# Patient Record
Sex: Female | Born: 1970 | Race: White | Hispanic: No | Marital: Married | Smoking: Never smoker
Health system: Southern US, Community
[De-identification: ages and names within clinical notes are randomized; demographics above are authoritative.]

## PROBLEM LIST (undated history)

## (undated) DIAGNOSIS — T7840XA Allergy, unspecified, initial encounter: Secondary | ICD-10-CM

## (undated) DIAGNOSIS — K9189 Other postprocedural complications and disorders of digestive system: Secondary | ICD-10-CM

## (undated) DIAGNOSIS — K567 Ileus, unspecified: Secondary | ICD-10-CM

## (undated) DIAGNOSIS — I10 Essential (primary) hypertension: Secondary | ICD-10-CM

## (undated) HISTORY — PX: ABDOMINAL HYSTERECTOMY: SHX81

## (undated) HISTORY — DX: Essential (primary) hypertension: I10

## (undated) HISTORY — DX: Allergy, unspecified, initial encounter: T78.40XA

---

## 1998-07-22 ENCOUNTER — Other Ambulatory Visit: Admission: RE | Admit: 1998-07-22 | Discharge: 1998-07-22 | Payer: Self-pay | Admitting: *Deleted

## 1998-11-21 ENCOUNTER — Encounter (INDEPENDENT_AMBULATORY_CARE_PROVIDER_SITE_OTHER): Payer: Self-pay

## 1998-11-21 ENCOUNTER — Ambulatory Visit (HOSPITAL_COMMUNITY): Admission: RE | Admit: 1998-11-21 | Discharge: 1998-11-21 | Payer: Self-pay | Admitting: Obstetrics and Gynecology

## 1999-04-09 ENCOUNTER — Other Ambulatory Visit: Admission: RE | Admit: 1999-04-09 | Discharge: 1999-04-09 | Payer: Self-pay | Admitting: *Deleted

## 1999-07-30 ENCOUNTER — Inpatient Hospital Stay (HOSPITAL_COMMUNITY): Admission: AD | Admit: 1999-07-30 | Discharge: 1999-07-30 | Payer: Self-pay | Admitting: Obstetrics and Gynecology

## 1999-08-21 ENCOUNTER — Inpatient Hospital Stay (HOSPITAL_COMMUNITY): Admission: AD | Admit: 1999-08-21 | Discharge: 1999-08-23 | Payer: Self-pay | Admitting: Obstetrics and Gynecology

## 1999-08-23 ENCOUNTER — Encounter: Payer: Self-pay | Admitting: Obstetrics and Gynecology

## 1999-09-12 ENCOUNTER — Inpatient Hospital Stay (HOSPITAL_COMMUNITY): Admission: AD | Admit: 1999-09-12 | Discharge: 1999-09-12 | Payer: Self-pay | Admitting: *Deleted

## 1999-09-12 ENCOUNTER — Encounter: Payer: Self-pay | Admitting: Obstetrics and Gynecology

## 1999-09-13 ENCOUNTER — Inpatient Hospital Stay (HOSPITAL_COMMUNITY): Admission: AD | Admit: 1999-09-13 | Discharge: 1999-09-17 | Payer: Self-pay | Admitting: Obstetrics and Gynecology

## 1999-10-01 ENCOUNTER — Ambulatory Visit (HOSPITAL_COMMUNITY): Admission: RE | Admit: 1999-10-01 | Discharge: 1999-10-01 | Payer: Self-pay | Admitting: *Deleted

## 1999-10-03 ENCOUNTER — Encounter (INDEPENDENT_AMBULATORY_CARE_PROVIDER_SITE_OTHER): Payer: Self-pay | Admitting: Specialist

## 1999-10-03 ENCOUNTER — Inpatient Hospital Stay (HOSPITAL_COMMUNITY): Admission: AD | Admit: 1999-10-03 | Discharge: 1999-10-06 | Payer: Self-pay | Admitting: Obstetrics & Gynecology

## 1999-11-12 ENCOUNTER — Other Ambulatory Visit: Admission: RE | Admit: 1999-11-12 | Discharge: 1999-11-12 | Payer: Self-pay | Admitting: *Deleted

## 2000-11-23 ENCOUNTER — Other Ambulatory Visit: Admission: RE | Admit: 2000-11-23 | Discharge: 2000-11-23 | Payer: Self-pay | Admitting: *Deleted

## 2001-09-26 ENCOUNTER — Encounter (INDEPENDENT_AMBULATORY_CARE_PROVIDER_SITE_OTHER): Payer: Self-pay | Admitting: Specialist

## 2001-09-27 ENCOUNTER — Inpatient Hospital Stay (HOSPITAL_COMMUNITY): Admission: RE | Admit: 2001-09-27 | Discharge: 2001-09-29 | Payer: Self-pay | Admitting: *Deleted

## 2004-01-10 ENCOUNTER — Other Ambulatory Visit: Admission: RE | Admit: 2004-01-10 | Discharge: 2004-01-10 | Payer: Self-pay | Admitting: Obstetrics and Gynecology

## 2006-05-27 ENCOUNTER — Encounter: Admission: RE | Admit: 2006-05-27 | Discharge: 2006-05-27 | Payer: Self-pay | Admitting: Obstetrics and Gynecology

## 2006-11-16 ENCOUNTER — Encounter: Admission: RE | Admit: 2006-11-16 | Discharge: 2006-11-16 | Payer: Self-pay | Admitting: Obstetrics and Gynecology

## 2007-05-29 ENCOUNTER — Encounter: Admission: RE | Admit: 2007-05-29 | Discharge: 2007-05-29 | Payer: Self-pay | Admitting: Obstetrics and Gynecology

## 2008-05-29 ENCOUNTER — Encounter: Admission: RE | Admit: 2008-05-29 | Discharge: 2008-05-29 | Payer: Self-pay | Admitting: Obstetrics and Gynecology

## 2009-09-23 ENCOUNTER — Encounter: Admission: RE | Admit: 2009-09-23 | Discharge: 2009-09-23 | Payer: Self-pay | Admitting: Obstetrics and Gynecology

## 2010-07-24 NOTE — Discharge Summary (Signed)
   NAMEHERO, MCCATHERN NO.:  [id number]   MEDICAL RECORD NO.:  [id number]                   PATIENT TYPE:   LOCATION:                                       FACILITY:   PHYSICIAN:  Tracie Harrier, M.D.              DATE OF BIRTH:   DATE OF ADMISSION:  09/26/2001  DATE OF DISCHARGE:  09/29/2001                                 DISCHARGE SUMMARY   HISTORY OF PRESENT ILLNESS:  The patient is a 40 year old female gravida 3,  para 2 status post tubal ligation admitted for hysterectomy for severe  dysmenorrhea and menometrorrhagia.   PAST MEDICAL HISTORY:  None.   PAST SURGICAL HISTORY:  1. D&E x1.  2. Cesarean section x2.   OBSTETRICAL HISTORY:  1. Cesarean section x2--second cesarean section with associated bilateral     tubal ligation.  2. SAB x1 with D&C.   CURRENT MEDICATIONS:  Motrin p.r.n.   ALLERGIES:  CODEINE.   PHYSICAL EXAMINATION:  Please see clinic admission History and Physical.   ADMISSION DIAGNOSES:  1. Severe dysmenorrhea.  2. Menometrorrhagia.   HOSPITAL COURSE:  The patient was admitted on the same day of surgery, September 26, 2001, where she underwent a laparoscopic-assisted vaginal hysterectomy  with ovarian conservation.  The surgery went well and uneventfully.  Blood  loss was 150 cc.  There was only scarring of the anterior bladder flap  noted.   The patient's postoperative course was somewhat slow.  Her postoperative  period was associated with nausea and vomiting and pain management issues.  She did slowly improve and was discharged on postoperative day #3 in stable  condition.  She had no serious complications in the postoperative period  other than some nausea and vomiting as mentioned.  She was afebrile in the  postoperative period and quickly resumed normal bowel and bladder function.  She did have pain management issues and as mentioned nausea and vomiting.   DISCHARGE DIAGNOSIS:  Stable status post  laparoscopic-assisted vaginal  hysterectomy.   PLAN:  1. Routine postoperative instructions given.  2. Nothing per vagina for six weeks.  3.     No heavy lifting or driving x2 weeks.  4. On Percocet dispense #50.  5. Follow up in the office in one week for a recheck.                                               Tracie Harrier, M.D.    REG/MEDQ  D:  11/05/2001  T:  11/05/2001  Job:  [id number]

## 2010-07-24 NOTE — H&P (Signed)
[facility]  Patient:    Hannah Farmer, Hannah Farmer Visit Number: [id number] MRN: [id number]          Service Type: GYN Location: 9300 [id number] Attending Physician:  Donne Hazel Dictated by:   Willey Blade, M.D. Admit Date:  09/26/2001                           History and Physical  HISTORY OF PRESENT ILLNESS:   The patient is a 40 year old female, gravida 3, para 2, status post bilateral tubal ligation, admitted for definitive therapy of severe dysmenorrhea and menometrorrhagia.  The patient was given different treatment options, including hormonal therapy versus conservative surgical treatment and requested a definitive surgical cure of her abnormal cycles and severe cramps during her menstrual cycle and during bleeding.  The patients symptoms are not improved by nonsteroidal anti-inflammatory agents and she is not interested in being treated hormonally.  She is now admitted to undergo laparoscopically-assisted vaginal hysterectomy.  PAST MEDICAL HISTORY:         None.  PAST SURGICAL HISTORY:        1. D&E x 1.                               2. Cesarean section x 2.  OBSTETRICAL HISTORY:          1. In 1998, a cesarean section at term.                               2. In 2000, SAB with D&C.                               3. In 2001, repeat cesarean section and                                  bilateral tubal ligation.  CURRENT MEDICATIONS:          Motrin p.r.n.  ALLERGIES:                    CODEINE.  PHYSICAL EXAMINATION:         Vital signs stable with blood pressure 108/70.  GENERAL APPEARANCE:           She is a well-developed, well-nourished female in no acute distress.  HEENT:                        Within normal limits.  NECK:                         Supple without adenopathy or thyromegaly.  HEART:                        Regular rate and rhythm without murmurs, rubs, or gallops.  LUNGS:                        Clear to  auscultation.  BREASTS:                      Exam done recently in the office was normal without masses,  tenderness, nipple discharge, or axillary adenopathy.  ABDOMEN:                      Soft and benign without masses, tenderness, or hernia.  PELVIC:                       Normal female external genitalia.  Vaginal and cervical normal without lesions.  The uterus is small, anterior, and mobile. The adnexa are clear.  ADMISSION DIAGNOSES:          1. Severe dysmenorrhea.                               2. Menometrorrhagia.                               3. Suspect adenomyosis.  PLAN:                         Laparoscopically-assisted vaginal hysterectomy.  The risks and benefits of this surgery were explained to the patient.  Also, alternative treatment options were reviewed with the patient.  She wished to proceed. Dictated by:   Willey Blade, M.D. Attending Physician:  Donne Hazel DD:  09/25/01 TD:  09/28/01 Job: [id number]

## 2010-07-24 NOTE — Op Note (Signed)
[facility]  Patient:    Hannah Farmer, Hannah Farmer Visit Number: [id number] MRN: [id number]          Service Type: GYN Location: [phone] Attending Physician:  Donne Hazel Dictated by:   Willey Blade, M.D. Proc. Date: 09/26/01 Admit Date:  09/26/2001 Discharge Date: 09/29/2001                             Operative Report  PREOPERATIVE DIAGNOSES:       1. Menometrorrhagia.                               2. Dysmenorrhea.  POSTOPERATIVE DIAGNOSES:      1. Menometrorrhagia.                               2. Dysmenorrhea.  OPERATION:                    Laparoscopic-assisted vaginal hysterectomy.  SURGEON:                      Willey Blade, M.D.  ANESTHESIA:                   General endotracheal.  ESTIMATED BLOOD LOSS:         150 cc.  COMPLICATIONS:                None.  DRAINS:                       Foley.  FINDINGS:                     At time of laparoscopy, the abdomen and pelvis were visualized. The uterus was of normal size and shape. There were some adhesions between the uterus and the bladder flap. The adnexa were visualized and noted to be normal except for patients previous tubal ligation.  The bowel was briefly visualized and noted to be normal. The appendix was also visualized and noted to be normal.  DESCRIPTION OF PROCEDURE:     The patient was taken to the operating room where general endotracheal anesthetic was administered. The patient was placed on the operating table in the dorsal lithotomy position. The abdomen, perineum, and vagina were prepped and draped in the usual sterile fashion with Betadine and sterile drapes. A red rubber catheter was used to empty the bladder. A Hulka tenaculum was placed inside the uterus for uterine manipulation. Next, a small vertical infraumbilical skin incision was made through which a Verres needle was inserted atraumatically into the abdominal cavity. A pneumoperitoneum was then  created with carbon dioxide gas until a pressure of 18 mmHg was achieved. The Verres needle was removed and a disposable laparoscopic trocar was inserted atraumatically into the abdominal cavity. Also, an accessory incision was made two fingerbreadths above the pubis symphysis and in the midline. Through this, a disposable 5-mm trocar was placed without difficulty. Next, the above noted findings were noted through the laparoscope. Attention was next turned to laparoscopic-assisted vaginal hysterectomy.  The uterus was dissected using the Gyrus 5 mm open forceps and this was done. The uterus was removed from the surrounding tissue with a burn and cut method with the Gyrus forceps. The utero-ovarian  ligaments were cauterized and divided with the Gyrus open forceps. Dissection was carried out and the uterus was dissected from the surrounding tissues with the Gyrus forceps until just inferior to the uterine artery pedicles. All vascular pedicles were cauterized with the Gyrus open forceps and divided with this as well. Again, this dissection was undertaken bilaterally from the utero-ovarian ligaments inferiorly to just beneath the uterine artery pedicles. Once this was undertaken, good hemostasis was noted and attention was turned to the vaginal aspect of this hysterectomy.  All vaginal instruments were removed. A weighted speculum was placed in the posterior fornix of the vagina. The cervix was grasped with a Gerilyn Pilgrim tenaculum and the posterior fornix was entered sharply and atraumatically. The uterosacral ligaments were then clamped bilaterally with the Gyrus open forceps and cauterized in the standard fashion and divided with sharp dissection. A circumferential incision was then made around the anterior portion of the cervix, then a bladder flap was bluntly and sharply created over the lower uterine segment and dissection was carried out cephalad. A bladder blade was placed behind the  bladder. Successive bites were then carried up the body of the uterus, hugging the uterus intently, and all vascular pedicles were again cauterized with the Gyrus open forceps and divided sharply. The lines of dissection were met and the uterus was dissected free without difficulty. Prior to this, the anterior cul-de-sac was atraumatically and sharply entered. At all times, the bladder was protected by having a retractor placed behind it during the procedure. Next, attention was turned to closure. All vascular pedicles were hemostatic.  Attention was turned to closure. The posterior cul-de-sac was obliterated by plicating the uterosacral ligaments in the midline with two sutures of 0 Vicryl. The vaginal cuff was then suspended to the uterosacral ligaments with a transfixing suture of 0 Vicryl bilaterally. The vaginal cuff was then closed anterior to posterior using 0 Monocryl sutures. This was done in its entirety. Good hemostasis was noted. A Foley was then inserted with clear urine obtained. The vagina was noted to be hemostatic.  The laparoscope was then used once again to visualize the operative areas and hemostasis was noted. The pelvis was irrigated using a Nezhat-Dorsey irrigator. After hemostasis was noted, all abdominal instruments were removed and the gas was allowed to completely escape from the abdomen. The small infraumbilical skin incision was closed with two sutures of 0 Vicryl in the muscle fascia. The skin was reapproximated with DermaBond glue. The patient was awakened, extubated, and taken to the recovery room in good condition. There were no perioperative complications. Final sponge, instrument, and needle counts x3. The patient did receive a preoperative antibiotic. Dictated by:   Willey Blade, M.D. Attending Physician:  Donne Hazel DD:  09/26/01 TD:  09/29/01 Job: [id number]

## 2010-07-24 NOTE — Discharge Summary (Signed)
[facility]  Patient:    Hannah Farmer                     MRN: [id number] Adm. Date:  [id number] Disc. Date: [id number] Attending:  Minette Headland Dictator:   Danie Chandler, R.N.                           Discharge Summary  ADMISSION DIAGNOSES:          1. Intrauterine pregnancy at 3 weeks estimated                                  gestational age.                               2. Anterior placenta previa.                               3. Previous cesarean section.                               4. Contractions with bleeding.  DISCHARGE DIAGNOSES:          1. Intrauterine pregnancy at 29 weeks estimated                                  gestational age.                               2. Anterior placenta previa.                               3. Previous cesarean section.                               4. Contractions with bleeding.  PROCEDURE:                    On October 03, 1999, repeat low transverse cesarean section and bilateral tubal ligation.  REASON FOR ADMISSION:         The patient is a 40 year old married white female, gravida 2, para 1, at [redacted] weeks gestation with previous cesarean section and known anterior placenta previa.  An amniocentesis was performed on September 30, 1999, which was consistent with an LS ratio of 1.9:1.  The patient presented on the night of October 03, 1999, with heavy vaginal bleeding which rapidly resolved on its own.  The patient continued to contract on October 03, 1999.  The patient had been on a terbutaline pump and had been covered in the hospital with subcutaneous terbutaline.  Due to the gestational age of [redacted] weeks and the heavy vaginal bleeding, it was recommended to proceed with a cesarean section.  HOSPITAL COURSE:              The patient was taken to the operating room and underwent the above-named procedure without complication.  This was productive of a viable female infant with Apgars of 8 at  1 minute  and 8 at 5 minutes and an anterior cord pH of 7.29.  Postoperatively, the patient did well.  On postoperative day #1, the patients hemoglobin was 9.3.  The patient was stable on postoperative day #2.  The patient had a good return of bowel function and was tolerating a regular diet.  She was also ambulating well without difficulty and had good pain control.  She was discharged to home on postoperative day #3.  CONDITION ON DISCHARGE:       Good.  DIET:                         Regular as tolerated.  ACTIVITY:                     No heavy lifting, no driving, no vaginal entry.  FOLLOWUP:                     She is to follow up in the office in one to two weeks for incision check, and she is to call for temperature greater than 100 degrees, persistent nausea or vomiting, heavy vaginal bleeding, and/or redness or drainage from the incision site.  DISCHARGE MEDICATIONS:        1. Prenatal vitamin 1 p.o. q.d.                               2. Tylox, #30, 1 to 2 p.o. q.3-4h. p.r.n. pain. DD:  10/26/99 TD:  10/27/99 Job: [id number]

## 2010-07-24 NOTE — Op Note (Signed)
[facility]  Patient:    Hannah Farmer                     MRN: [id number] Proc. Date: 10/03/99 Adm. Date:  [id number] Disc. Date: [id number] Attending:  Minette Headland                           Operative Report  PREOPERATIVE DIAGNOSES:       1. Intrauterine pregnancy at 68 weeks estimated                                  gestational age.                               2. Anterior placenta previa.                               3. Previous cesarean section.                               4. Contractions with bleeding.  POSTOPERATIVE DIAGNOSES:      1. Intrauterine pregnancy at 52 weeks estimated                                  gestational age.                               2. Anterior placenta previa.                               3. Previous cesarean section.                               4. Contractions with bleeding.  PROCEDURE:                    1. Repeat low transverse cesarean section.                               2. Bilateral tubal ligation.  SURGEON:                      Guy Sandifer. Arleta Creek, M.D.  ASSISTANTFreddy Finner, M.D.  ANESTHESIA:                   Spinal by Belva Agee, M.D.  ESTIMATED BLOOD LOSS:         900 cc.  FINDINGS:                     Viable female infant, Apgars of 8/8 at one and five minutes, respectively.  Birth weight 6 pounds 2 ounces.  Arterial cord: pH 7.29.  INDICATIONS AND CONSENTS:     The patient is a 40 year old married white female, G2, P1  at 36 weeks with previous cesarean section and known anterior placenta previa.  Amniocentesis on July 25 was consistent with an LS ratio of 1.9 to 1.  The patient presented last night with heavy vaginal bleeding which rapidly resolved on its own.  Continues to contract today.  She has been on a Terbutaline pump and she has been covered in the hospital with subcutaneous Terbutaline.  Due to the gestational age of [redacted] weeks and the heavy  vaginal bleeding with its unpredictable recurrence and potentially life threatening outcome for the baby and/or the mother a cesarean section is recommended. This was discussed with the patient and her husband.  Possible risks and complications were discussed including but not limited to infection, bowel/bladder/ureteral damage, bleeding requiring transfusion and blood products with possible transfusion reaction, HIV and hepatitis acquisition, DVT, PE, pneumonia and cesarean hysterectomy.  The patient also requests tubal ligation and this has been discussed.  permanence, failure rate and increased ectopic risks were discussed. The patients blood type is A positive.  The blood bank in the hospital states they have two units of A positive and two units of A-negative blood.  An A typing screen is available in the blood bank at the present time.  DESCRIPTION OF PROCEDURE:     The patient is taken to the operating room where spinal anesthetic is placed.  She was then placed in the dorsal supine position with a 15 degree left lateral wedge.  She is prepped.  A Foley catheter was placed in the bladder as a drain and she is draped in a sterile fashion.  After testing for adequate anesthesia, the skin is entered through a previous Pfannenstiel scar and dissection was carried out in layers of the peritoneum.  The peritoneum was sharply entered and extending superiorly and inferiorly.  The vesicouterine peritoneum is taken down cephalolaterally. laterally.  The bladder flap is developed and the bladder blade is placed. Uterus is incised in a low transverse manner and the cavity is entered bluntly with a Kelly clamp.  The uterine incision is then extended cephalolaterally laterally with the fingers.  The placenta is encountered anteriorly.  This is dissected from the anterior vaginal wall and reaching above the level of the placental margin.  Artificial rupture of membranes is carried out.  The  infant is in the transverse backup position.  The feet are grasped.  The babys hips are mobilized and then is delivered from the Breech presentation without difficulty.  Oronasal pharynx are suctioned.  The cord is clamped and cut and the infant is handed to the waiting pediatrics team.  The placenta is manually delivered and sent to pathology.  Uterus is exteriorized and careful inspection reveals the placenta has been completely removed and there is no excessive bleeding from the placental bed.  The uterus is closed in a running locking fashion with 0 Monocryl suture.  There is a laceration of a branch of the cervical artery on the right angle of the incision.  This was controlled initially with a ring clamp.  This was ligated with an additional through and through suture and followed by a figure-of-eight suture which was observed both carefully anteriorly and posteriorly on the uterine surfaces.  This ligates the artery and obtains complete hemostasis.  Next, the left fallopian tube is grasped in its midportion with a Babcock clamp.  Knuckle of tube is then doubly ligated with 0-3 ties.  Intervening knuckle of tissue is resected. Minimal monopolar cautery is used to assure hemostasis.  A similar procedure is carried out on the right side.  These are sent to pathology.  Ovaries are normal bilaterally.  Uterus is returned to the abdominal cavity.  Copious irrigation is carried out and good hemostasis is again assured.  The anterior peritoneum is then closed in a running fashion with 0 Monocryl suture which is also used to reapproximate the pyramidis muscle in the midline.  Anterior rectus fascia is closed in a running fashion with 0 PDS suture.  The skin is closed with clips.  All sponge, instrument and needle counts are correct.  The patient is transferred to the recovery room in stable condition. DD:  10/03/99 TD:  10/05/99 Job: [id number]

## 2010-11-24 ENCOUNTER — Other Ambulatory Visit: Payer: Self-pay | Admitting: Obstetrics and Gynecology

## 2010-11-24 DIAGNOSIS — Z1231 Encounter for screening mammogram for malignant neoplasm of breast: Secondary | ICD-10-CM

## 2010-12-01 ENCOUNTER — Ambulatory Visit
Admission: RE | Admit: 2010-12-01 | Discharge: 2010-12-01 | Disposition: A | Discharge status: Home / Self Care | Payer: 59 | Source: Ambulatory Visit | Attending: Obstetrics and Gynecology | Admitting: Obstetrics and Gynecology

## 2010-12-01 DIAGNOSIS — Z1231 Encounter for screening mammogram for malignant neoplasm of breast: Secondary | ICD-10-CM

## 2012-01-27 ENCOUNTER — Other Ambulatory Visit: Payer: Self-pay | Admitting: Obstetrics and Gynecology

## 2012-01-27 DIAGNOSIS — Z1231 Encounter for screening mammogram for malignant neoplasm of breast: Secondary | ICD-10-CM

## 2012-02-16 ENCOUNTER — Ambulatory Visit
Admission: RE | Admit: 2012-02-16 | Discharge: 2012-02-16 | Disposition: A | Discharge status: Home / Self Care | Payer: 59 | Source: Ambulatory Visit | Attending: Obstetrics and Gynecology | Admitting: Obstetrics and Gynecology

## 2012-02-16 DIAGNOSIS — Z1231 Encounter for screening mammogram for malignant neoplasm of breast: Secondary | ICD-10-CM

## 2012-12-28 ENCOUNTER — Ambulatory Visit: Payer: 59 | Admitting: Family Medicine

## 2012-12-28 DIAGNOSIS — Z0289 Encounter for other administrative examinations: Secondary | ICD-10-CM

## 2013-04-30 ENCOUNTER — Other Ambulatory Visit: Payer: Self-pay

## 2013-04-30 DIAGNOSIS — Z1231 Encounter for screening mammogram for malignant neoplasm of breast: Secondary | ICD-10-CM

## 2013-05-11 ENCOUNTER — Ambulatory Visit: Payer: 59

## 2014-01-20 ENCOUNTER — Encounter (HOSPITAL_COMMUNITY): Admission: EM | Disposition: A | Discharge status: Home / Self Care | Payer: Self-pay | Source: Home / Self Care

## 2014-01-20 ENCOUNTER — Emergency Department (HOSPITAL_COMMUNITY): Payer: 59

## 2014-01-20 ENCOUNTER — Emergency Department (HOSPITAL_COMMUNITY): Payer: 59 | Admitting: Anesthesiology

## 2014-01-20 ENCOUNTER — Other Ambulatory Visit (HOSPITAL_COMMUNITY): Payer: 59

## 2014-01-20 ENCOUNTER — Ambulatory Visit (INDEPENDENT_AMBULATORY_CARE_PROVIDER_SITE_OTHER): Payer: 59

## 2014-01-20 ENCOUNTER — Inpatient Hospital Stay (HOSPITAL_COMMUNITY)
Admission: EM | Admit: 2014-01-20 | Discharge: 2014-01-23 | DRG: 340 | Disposition: A | Discharge status: Home / Self Care | Payer: 59 | Attending: General Surgery | Admitting: General Surgery

## 2014-01-20 ENCOUNTER — Encounter (HOSPITAL_COMMUNITY): Payer: Self-pay | Admitting: Emergency Medicine

## 2014-01-20 ENCOUNTER — Ambulatory Visit (INDEPENDENT_AMBULATORY_CARE_PROVIDER_SITE_OTHER): Payer: 59 | Admitting: Internal Medicine

## 2014-01-20 VITALS — BP 144/92 | HR 85 | Temp 98.5°F | Resp 16 | Ht 64.5 in | Wt 170.0 lb

## 2014-01-20 DIAGNOSIS — K352 Acute appendicitis with generalized peritonitis: Secondary | ICD-10-CM | POA: Diagnosis not present

## 2014-01-20 DIAGNOSIS — K3532 Acute appendicitis with perforation and localized peritonitis, without abscess: Secondary | ICD-10-CM | POA: Diagnosis present

## 2014-01-20 DIAGNOSIS — R1031 Right lower quadrant pain: Secondary | ICD-10-CM

## 2014-01-20 DIAGNOSIS — K9189 Other postprocedural complications and disorders of digestive system: Secondary | ICD-10-CM

## 2014-01-20 DIAGNOSIS — K567 Ileus, unspecified: Secondary | ICD-10-CM

## 2014-01-20 DIAGNOSIS — R100 Acute abdomen: Secondary | ICD-10-CM

## 2014-01-20 DIAGNOSIS — K3580 Unspecified acute appendicitis: Secondary | ICD-10-CM | POA: Diagnosis present

## 2014-01-20 DIAGNOSIS — Z90710 Acquired absence of both cervix and uterus: Secondary | ICD-10-CM

## 2014-01-20 HISTORY — PX: LAPAROSCOPIC APPENDECTOMY: SHX408

## 2014-01-20 HISTORY — DX: Ileus, unspecified: K56.7

## 2014-01-20 HISTORY — DX: Other postprocedural complications and disorders of digestive system: K91.89

## 2014-01-20 LAB — COMPREHENSIVE METABOLIC PANEL
ALBUMIN: 4 g/dL (ref 3.5–5.2)
ALT: 31 U/L (ref 0–35)
ANION GAP: 16 — AB (ref 5–15)
AST: 34 U/L (ref 0–37)
Alkaline Phosphatase: 66 U/L (ref 39–117)
BUN: 11 mg/dL (ref 6–23)
CALCIUM: 9.4 mg/dL (ref 8.4–10.5)
CO2: 21 mEq/L (ref 19–32)
CREATININE: 0.76 mg/dL (ref 0.50–1.10)
Chloride: 97 mEq/L (ref 96–112)
GFR calc Af Amer: >90 mL/min (ref >90)
GFR calc non Af Amer: >90 mL/min (ref >90)
Glucose, Bld: 88 mg/dL (ref 70–99)
Potassium: 4.6 mEq/L (ref 3.7–5.3)
Sodium: 134 mEq/L — ABNORMAL LOW (ref 137–147)
TOTAL PROTEIN: 7.9 g/dL (ref 6.0–8.3)
Total Bilirubin: 0.7 mg/dL (ref 0.3–1.2)

## 2014-01-20 LAB — COMPLETE METABOLIC PANEL WITH GFR
ALBUMIN: 4.8 g/dL (ref 3.5–5.2)
ALT: 31 U/L (ref 0–35)
AST: 27 U/L (ref 0–37)
Alkaline Phosphatase: 65 U/L (ref 39–117)
BILIRUBIN TOTAL: 0.7 mg/dL (ref 0.2–1.2)
BUN: 11 mg/dL (ref 6–23)
CO2: 25 meq/L (ref 19–32)
Calcium: 9.2 mg/dL (ref 8.4–10.5)
Chloride: 101 mEq/L (ref 96–112)
Creat: 0.8 mg/dL (ref 0.50–1.10)
GFR, Est Non African American: >89 mL/min
GLUCOSE: 104 mg/dL — AB (ref 70–99)
POTASSIUM: 3.8 meq/L (ref 3.5–5.3)
SODIUM: 137 meq/L (ref 135–145)
TOTAL PROTEIN: 7.8 g/dL (ref 6.0–8.3)

## 2014-01-20 LAB — CBC WITH DIFFERENTIAL/PLATELET
BASOS ABS: 0 10*3/uL (ref 0.0–0.1)
BASOS PCT: 0 % (ref 0–1)
EOS ABS: 0 10*3/uL (ref 0.0–0.7)
Eosinophils Relative: 1 % (ref 0–5)
HEMATOCRIT: 43.9 % (ref 36.0–46.0)
HEMOGLOBIN: 14.8 g/dL (ref 12.0–15.0)
Lymphocytes Relative: 23 % (ref 12–46)
Lymphs Abs: 1.9 10*3/uL (ref 0.7–4.0)
MCH: 30.8 pg (ref 26.0–34.0)
MCHC: 33.7 g/dL (ref 30.0–36.0)
MCV: 91.3 fL (ref 78.0–100.0)
MONO ABS: 0.5 10*3/uL (ref 0.1–1.0)
MONOS PCT: 5 % (ref 3–12)
NEUTROS PCT: 71 % (ref 43–77)
Neutro Abs: 6 10*3/uL (ref 1.7–7.7)
Platelets: 238 10*3/uL (ref 150–400)
RBC: 4.81 MIL/uL (ref 3.87–5.11)
RDW: 12.6 % (ref 11.5–15.5)
WBC: 8.5 10*3/uL (ref 4.0–10.5)

## 2014-01-20 LAB — URINALYSIS, ROUTINE W REFLEX MICROSCOPIC
BILIRUBIN URINE: NEGATIVE
Glucose, UA: NEGATIVE mg/dL
Hgb urine dipstick: NEGATIVE
Ketones, ur: 15 mg/dL — AB
LEUKOCYTES UA: NEGATIVE
NITRITE: NEGATIVE
Protein, ur: NEGATIVE mg/dL
SPECIFIC GRAVITY, URINE: 1.016 (ref 1.005–1.030)
UROBILINOGEN UA: 0.2 mg/dL (ref 0.0–1.0)
pH: 5.5 (ref 5.0–8.0)

## 2014-01-20 LAB — POCT CBC
GRANULOCYTE PERCENT: 75.9 % (ref 37–80)
HCT, POC: 46.4 % (ref 37.7–47.9)
Hemoglobin: 15.5 g/dL (ref 12.2–16.2)
Lymph, poc: 1.9 (ref 0.6–3.4)
MCH, POC: 31.2 pg (ref 27–31.2)
MCHC: 33.5 g/dL (ref 31.8–35.4)
MCV: 93.2 fL (ref 80–97)
MID (CBC): 0.4 (ref 0–0.9)
MPV: 6.9 fL (ref 0–99.8)
PLATELET COUNT, POC: 315 10*3/uL (ref 142–424)
POC GRANULOCYTE: 7.3 — AB (ref 2–6.9)
POC LYMPH %: 19.8 % (ref 10–50)
POC MID %: 4.3 % (ref 0–12)
RBC: 4.98 M/uL (ref 4.04–5.48)
RDW, POC: 12.8 %
WBC: 9.6 10*3/uL (ref 4.6–10.2)

## 2014-01-20 LAB — POCT URINALYSIS DIPSTICK
BILIRUBIN UA: NEGATIVE
Blood, UA: NEGATIVE
GLUCOSE UA: NEGATIVE
KETONES UA: NEGATIVE
LEUKOCYTES UA: NEGATIVE
Nitrite, UA: NEGATIVE
PROTEIN UA: NEGATIVE
SPEC GRAV UA: 1.02
Urobilinogen, UA: 0.2
pH, UA: 5

## 2014-01-20 LAB — POCT UA - MICROSCOPIC ONLY
BACTERIA, U MICROSCOPIC: NEGATIVE
CRYSTALS, UR, HPF, POC: NEGATIVE
Casts, Ur, LPF, POC: NEGATIVE
Mucus, UA: NEGATIVE
RBC, URINE, MICROSCOPIC: NEGATIVE
WBC, UR, HPF, POC: NEGATIVE
Yeast, UA: NEGATIVE

## 2014-01-20 LAB — LIPASE, BLOOD: LIPASE: 29 U/L (ref 11–59)

## 2014-01-20 SURGERY — APPENDECTOMY, LAPAROSCOPIC
Anesthesia: General | Site: Abdomen

## 2014-01-20 MED ORDER — ACETAMINOPHEN 10 MG/ML IV SOLN
1000.0000 mg | Freq: Once | INTRAVENOUS | Status: AC
  Administered 2014-01-20: 1000 mg via INTRAVENOUS
  Filled 2014-01-20: qty 100

## 2014-01-20 MED ORDER — FENTANYL CITRATE 0.05 MG/ML IJ SOLN
INTRAMUSCULAR | Status: DC | PRN
  Administered 2014-01-20 (×5): 50 ug via INTRAVENOUS

## 2014-01-20 MED ORDER — SUCCINYLCHOLINE CHLORIDE 20 MG/ML IJ SOLN
INTRAMUSCULAR | Status: DC | PRN
  Administered 2014-01-20: 80 mg via INTRAVENOUS

## 2014-01-20 MED ORDER — HYDROMORPHONE HCL 1 MG/ML IJ SOLN
INTRAMUSCULAR | Status: AC
  Filled 2014-01-20: qty 1

## 2014-01-20 MED ORDER — GLYCOPYRROLATE 0.2 MG/ML IJ SOLN
INTRAMUSCULAR | Status: AC
  Filled 2014-01-20: qty 3

## 2014-01-20 MED ORDER — OXYCODONE HCL 5 MG PO TABS
5.0000 mg | ORAL_TABLET | ORAL | Status: DC | PRN
  Administered 2014-01-21 (×2): 5 mg via ORAL
  Filled 2014-01-20 (×2): qty 1

## 2014-01-20 MED ORDER — BUPIVACAINE HCL 0.25 % IJ SOLN
INTRAMUSCULAR | Status: DC | PRN
  Administered 2014-01-20: 15 mL

## 2014-01-20 MED ORDER — LIP MEDEX EX OINT
TOPICAL_OINTMENT | CUTANEOUS | Status: AC
Start: 2014-01-20 — End: 2014-01-21
  Filled 2014-01-20: qty 7

## 2014-01-20 MED ORDER — ONDANSETRON HCL 4 MG/2ML IJ SOLN: 4.0000 mg | Freq: Once | INTRAMUSCULAR | Status: DC | PRN

## 2014-01-20 MED ORDER — DIPHENHYDRAMINE HCL 50 MG/ML IJ SOLN: 12.5000 mg | Freq: Four times a day (QID) | INTRAMUSCULAR | Status: DC | PRN

## 2014-01-20 MED ORDER — HYDROMORPHONE HCL 1 MG/ML IJ SOLN
0.2500 mg | INTRAMUSCULAR | Status: DC | PRN
  Administered 2014-01-20 (×3): 0.5 mg via INTRAVENOUS

## 2014-01-20 MED ORDER — SODIUM CHLORIDE 0.9 % IV SOLN
INTRAVENOUS | Status: DC
  Administered 2014-01-20: 15:00:00 via INTRAVENOUS

## 2014-01-20 MED ORDER — ONDANSETRON HCL 4 MG/2ML IJ SOLN
INTRAMUSCULAR | Status: AC
  Filled 2014-01-20: qty 2

## 2014-01-20 MED ORDER — PROPOFOL 10 MG/ML IV BOLUS
INTRAVENOUS | Status: AC
  Filled 2014-01-20: qty 20

## 2014-01-20 MED ORDER — LIDOCAINE HCL (CARDIAC) 20 MG/ML IV SOLN
INTRAVENOUS | Status: DC | PRN
  Administered 2014-01-20: 75 mg via INTRAVENOUS

## 2014-01-20 MED ORDER — MORPHINE SULFATE 2 MG/ML IJ SOLN
2.0000 mg | INTRAMUSCULAR | Status: DC | PRN
  Administered 2014-01-20 (×2): 2 mg via INTRAVENOUS
  Filled 2014-01-20 (×2): qty 1

## 2014-01-20 MED ORDER — SODIUM CHLORIDE 0.9 % IV SOLN
INTRAVENOUS | Status: DC
  Administered 2014-01-20 – 2014-01-22 (×3): via INTRAVENOUS
  Administered 2014-01-22: 100 mL/h via INTRAVENOUS
  Administered 2014-01-23: 08:00:00 via INTRAVENOUS

## 2014-01-20 MED ORDER — IOHEXOL 300 MG/ML  SOLN
100.0000 mL | Freq: Once | INTRAMUSCULAR | Status: AC | PRN
  Administered 2014-01-20: 100 mL via INTRAVENOUS

## 2014-01-20 MED ORDER — LIDOCAINE HCL (CARDIAC) 20 MG/ML IV SOLN
INTRAVENOUS | Status: AC
  Filled 2014-01-20: qty 5

## 2014-01-20 MED ORDER — FENTANYL CITRATE 0.05 MG/ML IJ SOLN
50.0000 ug | Freq: Once | INTRAMUSCULAR | Status: AC
  Administered 2014-01-20: 50 ug via INTRAVENOUS
  Filled 2014-01-20: qty 2

## 2014-01-20 MED ORDER — ONDANSETRON HCL 4 MG/2ML IJ SOLN
INTRAMUSCULAR | Status: DC | PRN
  Administered 2014-01-20: 4 mg via INTRAVENOUS

## 2014-01-20 MED ORDER — SODIUM CHLORIDE 0.9 % IV BOLUS (SEPSIS)
1000.0000 mL | Freq: Once | INTRAVENOUS | Status: AC
  Administered 2014-01-20: 1000 mL via INTRAVENOUS

## 2014-01-20 MED ORDER — LACTATED RINGERS IR SOLN
Status: DC | PRN
  Administered 2014-01-20: 1000 mL

## 2014-01-20 MED ORDER — LACTATED RINGERS IV SOLN
INTRAVENOUS | Status: DC | PRN
  Administered 2014-01-20 (×2): via INTRAVENOUS

## 2014-01-20 MED ORDER — SODIUM CHLORIDE 0.9 % IJ SOLN
INTRAMUSCULAR | Status: AC
  Filled 2014-01-20: qty 50

## 2014-01-20 MED ORDER — MIDAZOLAM HCL 5 MG/5ML IJ SOLN
INTRAMUSCULAR | Status: DC | PRN
  Administered 2014-01-20: 2 mg via INTRAVENOUS

## 2014-01-20 MED ORDER — ONDANSETRON HCL 4 MG/2ML IJ SOLN
4.0000 mg | Freq: Four times a day (QID) | INTRAMUSCULAR | Status: DC | PRN
  Administered 2014-01-22 – 2014-01-23 (×2): 4 mg via INTRAVENOUS
  Filled 2014-01-20 (×2): qty 2

## 2014-01-20 MED ORDER — 0.9 % SODIUM CHLORIDE (POUR BTL) OPTIME
TOPICAL | Status: DC | PRN
  Administered 2014-01-20: 1000 mL

## 2014-01-20 MED ORDER — ACETAMINOPHEN 650 MG RE SUPP: 650.0000 mg | Freq: Four times a day (QID) | RECTAL | Status: DC | PRN

## 2014-01-20 MED ORDER — DEXAMETHASONE SODIUM PHOSPHATE 10 MG/ML IJ SOLN
INTRAMUSCULAR | Status: AC
  Filled 2014-01-20: qty 1

## 2014-01-20 MED ORDER — FENTANYL CITRATE 0.05 MG/ML IJ SOLN
INTRAMUSCULAR | Status: AC
  Filled 2014-01-20: qty 5

## 2014-01-20 MED ORDER — PIPERACILLIN-TAZOBACTAM 3.375 G IVPB
3.3750 g | Freq: Once | INTRAVENOUS | Status: AC
  Administered 2014-01-20: 3.375 g via INTRAVENOUS
  Filled 2014-01-20: qty 50

## 2014-01-20 MED ORDER — DEXAMETHASONE SODIUM PHOSPHATE 10 MG/ML IJ SOLN
INTRAMUSCULAR | Status: DC | PRN
  Administered 2014-01-20: 10 mg via INTRAVENOUS

## 2014-01-20 MED ORDER — BUPIVACAINE HCL (PF) 0.25 % IJ SOLN
INTRAMUSCULAR | Status: AC
  Filled 2014-01-20: qty 30

## 2014-01-20 MED ORDER — CISATRACURIUM BESYLATE (PF) 10 MG/5ML IV SOLN
INTRAVENOUS | Status: DC | PRN
  Administered 2014-01-20: 4 mg via INTRAVENOUS

## 2014-01-20 MED ORDER — NEOSTIGMINE METHYLSULFATE 10 MG/10ML IV SOLN
INTRAVENOUS | Status: DC | PRN
  Administered 2014-01-20: 4 mg via INTRAVENOUS

## 2014-01-20 MED ORDER — HEPARIN SODIUM (PORCINE) 5000 UNIT/ML IJ SOLN
5000.0000 [IU] | Freq: Three times a day (TID) | INTRAMUSCULAR | Status: DC
  Administered 2014-01-21 – 2014-01-23 (×8): 5000 [IU] via SUBCUTANEOUS
  Filled 2014-01-20 (×11): qty 1

## 2014-01-20 MED ORDER — DIPHENHYDRAMINE HCL 12.5 MG/5ML PO ELIX: 12.5000 mg | ORAL_SOLUTION | Freq: Four times a day (QID) | ORAL | Status: DC | PRN

## 2014-01-20 MED ORDER — PROPOFOL 10 MG/ML IV BOLUS
INTRAVENOUS | Status: DC | PRN
  Administered 2014-01-20: 150 mg via INTRAVENOUS

## 2014-01-20 MED ORDER — PIPERACILLIN-TAZOBACTAM 3.375 G IVPB
3.3750 g | Freq: Three times a day (TID) | INTRAVENOUS | Status: DC
  Administered 2014-01-20 – 2014-01-23 (×8): 3.375 g via INTRAVENOUS
  Filled 2014-01-20 (×9): qty 50

## 2014-01-20 MED ORDER — IOHEXOL 300 MG/ML  SOLN
50.0000 mL | Freq: Once | INTRAMUSCULAR | Status: AC | PRN
  Administered 2014-01-20: 50 mL via ORAL

## 2014-01-20 MED ORDER — HYDROMORPHONE HCL 1 MG/ML IJ SOLN
1.0000 mg | Freq: Once | INTRAMUSCULAR | Status: AC
  Administered 2014-01-20: 1 mg via INTRAVENOUS
  Filled 2014-01-20: qty 1

## 2014-01-20 MED ORDER — GLYCOPYRROLATE 0.2 MG/ML IJ SOLN
INTRAMUSCULAR | Status: DC | PRN
  Administered 2014-01-20: .6 mg via INTRAVENOUS

## 2014-01-20 MED ORDER — MIDAZOLAM HCL 2 MG/2ML IJ SOLN
INTRAMUSCULAR | Status: AC
  Filled 2014-01-20: qty 2

## 2014-01-20 MED ORDER — ONDANSETRON HCL 4 MG/2ML IJ SOLN
4.0000 mg | Freq: Once | INTRAMUSCULAR | Status: DC
  Filled 2014-01-20: qty 2

## 2014-01-20 MED ORDER — ACETAMINOPHEN 325 MG PO TABS: 650.0000 mg | ORAL_TABLET | Freq: Four times a day (QID) | ORAL | Status: DC | PRN

## 2014-01-20 SURGICAL SUPPLY — 47 items
ADH SKN CLS APL DERMABOND .7 (GAUZE/BANDAGES/DRESSINGS) ×1
BAG SPEC RTRVL LRG 6X4 10 (ENDOMECHANICALS) ×1
CABLE HIGH FREQUENCY MONO STRZ (ELECTRODE) ×3 IMPLANT
CANISTER SUCTION 2500CC (MISCELLANEOUS) ×1 IMPLANT
CHLORAPREP W/TINT 26ML (MISCELLANEOUS) ×3 IMPLANT
COVER SURGICAL LIGHT HANDLE (MISCELLANEOUS) ×1 IMPLANT
CUTTER FLEX LINEAR 45M (STAPLE) ×3 IMPLANT
DECANTER SPIKE VIAL GLASS SM (MISCELLANEOUS) ×1 IMPLANT
DERMABOND ADVANCED (GAUZE/BANDAGES/DRESSINGS) ×2
DERMABOND ADVANCED .7 DNX12 (GAUZE/BANDAGES/DRESSINGS) IMPLANT
DRAIN CHANNEL RND F F (WOUND CARE) ×2 IMPLANT
DRAPE LAPAROSCOPIC ABDOMINAL (DRAPES) ×3 IMPLANT
ELECT REM PT RETURN 9FT ADLT (ELECTROSURGICAL) ×3
ELECTRODE REM PT RTRN 9FT ADLT (ELECTROSURGICAL) ×1 IMPLANT
EVACUATOR SILICONE 100CC (DRAIN) ×2 IMPLANT
GLOVE BIO SURGEON STRL SZ7 (GLOVE) ×6 IMPLANT
GLOVE BIOGEL PI IND STRL 7.0 (GLOVE) ×1 IMPLANT
GLOVE BIOGEL PI IND STRL 7.5 (GLOVE) ×2 IMPLANT
GLOVE BIOGEL PI INDICATOR 7.0 (GLOVE) ×2
GLOVE BIOGEL PI INDICATOR 7.5 (GLOVE) ×4
GOWN STRL REUS W/ TWL XL LVL3 (GOWN DISPOSABLE) ×1 IMPLANT
GOWN STRL REUS W/TWL LRG LVL3 (GOWN DISPOSABLE) ×6 IMPLANT
GOWN STRL REUS W/TWL XL LVL3 (GOWN DISPOSABLE) ×6 IMPLANT
KIT BASIN OR (CUSTOM PROCEDURE TRAY) ×3 IMPLANT
LIQUID BAND (GAUZE/BANDAGES/DRESSINGS) ×3 IMPLANT
NS IRRIG 1000ML POUR BTL (IV SOLUTION) ×3 IMPLANT
PENCIL BUTTON HOLSTER BLD 10FT (ELECTRODE) IMPLANT
POUCH SPECIMEN RETRIEVAL 10MM (ENDOMECHANICALS) ×3 IMPLANT
RELOAD 45 VASCULAR/THIN (ENDOMECHANICALS) IMPLANT
RELOAD STAPLE 45 2.5 WHT GRN (ENDOMECHANICALS) IMPLANT
RELOAD STAPLE 45 3.5 BLU ETS (ENDOMECHANICALS) ×1 IMPLANT
RELOAD STAPLE TA45 3.5 REG BLU (ENDOMECHANICALS) ×3 IMPLANT
SET IRRIG TUBING LAPAROSCOPIC (IRRIGATION / IRRIGATOR) ×3 IMPLANT
SHEARS HARMONIC ACE PLUS 36CM (ENDOMECHANICALS) ×3 IMPLANT
SOLUTION ANTI FOG 6CC (MISCELLANEOUS) ×1 IMPLANT
SPONGE DRAIN TRACH 4X4 STRL 2S (GAUZE/BANDAGES/DRESSINGS) ×2 IMPLANT
SUT ETHILON 3 0 PS 1 (SUTURE) ×2 IMPLANT
SUT MNCRL AB 4-0 PS2 18 (SUTURE) ×3 IMPLANT
SUT VICRYL 0 ENDOLOOP (SUTURE) IMPLANT
SUT VICRYL 0 UR6 27IN ABS (SUTURE) ×2 IMPLANT
TAPE CLOTH SURG 4X10 WHT LF (GAUZE/BANDAGES/DRESSINGS) ×2 IMPLANT
TOWEL OR 17X26 10 PK STRL BLUE (TOWEL DISPOSABLE) ×3 IMPLANT
TRAY FOLEY CATH 14FRSI W/METER (CATHETERS) ×3 IMPLANT
TRAY LAPAROSCOPIC (CUSTOM PROCEDURE TRAY) ×3 IMPLANT
TROCAR BLADELESS OPT 5 75 (ENDOMECHANICALS) ×6 IMPLANT
TROCAR XCEL BLUNT TIP 100MML (ENDOMECHANICALS) ×3 IMPLANT
TUBING INSUFFLATION 10FT LAP (TUBING) ×3 IMPLANT

## 2014-01-20 NOTE — Op Note (Signed)
Preoperative diagnosis: acute appendicitis Postoperative diagnosis: ruptured appendicitis Procedure:Laparoscopic appendectomy Surgeon: Dr Harden MoMatt Bronislaus Verdell Anesthesia: Gen. Estimated blood loss: Minimal Drains: 4519 French Blake drain to right lower quadrant Complications: None Specimens: Appendix to pathology Sponge and needle count was correct at completion Disposition to recovery stable  Indications: This a 43 year old healthy female presents with 3 days of abdominal pain localizing to her right lower quadrant. She has a normal white blood cell count. She is very tender in her right lower quadrant. She has a CT scan that is consistent with appendicitis. This does not appear that it is ruptured although she certainly is at risk given the time course. We discussed all the options and decided to proceed with laparoscopic appendectomy.  Procedure: After informed consent was obtained the patient was taken to the operating room. She had been given antibiotics in the emergency room. She had sequential compression devices on her legs. She was then placed under general anesthesia without complication. A Foley catheter was placed. She was prepped and draped in the standard sterile surgical fashion. A surgical timeout was then performed.  I infiltrated Marcaine below her umbilicus. I then made a vertical incision. I grasped her fascia and incised this sharply. I entered into her peritoneum bluntly. I then placed a 0 Vicryl pursestring suture through the fascia. I then inserted a Hasson trocar. I insufflated the abdomen to 15 mmHg pressure. I then inserted 2 further 5 mm trocars in the suprapubic region and left lower quadrant. I then was able to view the right lower quadrant. She clearly had acute suppurative appendicitis. Once I began mobilizing this I did realize that the large fecalith had come out not too far from the base of the appendix. Her ovary was adherent to this and I separated this bluntly. I was  able to dissect the base. I came across the appendiceal mesentery with a Harmonic scalpel. I viewed the small bowel and there was no evidence of any injury. The appendiceal orifice was very near where the terminal ileum came in. I was able to get the stapler on the base of the appendix and fired it. This was viable tissue. This was right on the cecum. I viewed this staple line and it was hemostatic and appeared to be intact. I placed the appendix in a bag and removed from the umbilicus. I irrigated this. I viewed the staple line again and this all appeared healthy. I then placed omentum overlying the staple line covering it well. The only other option would be to do ileocecectomy at this point and I thought this would heal. The tissue was friable and I did not want to place stitches either. I did elect to place a 4819 [place] Blake drain in the right lower quadrant as well. I secured this with a 2-0 nylon suture. I then removed my remaining trocars and desufflated the abdomen. I did tie down my pursestring suture. I did place additional 0 Vicryl sutures in the umbilicus to completely close this defect. I then closed the skin with 4-0 Monocryl and glue. She tolerated this well was extubated and transferred to the recovery room in stable condition. We will need to monitor her right lower quadrant as she is certainly has risk for an abscess given the findings at operation. I discussed this with her husband.

## 2014-01-20 NOTE — H&P (Signed)
Hannah Farmer is an 43 y.o. female.   Chief Complaint: abd pain HPI: 53 yof who is otherwise healthy presents with 72 hours of abdominal pain starting epigastrium and ruq that has now progressed to rlq pain that is unrelenting. She has no n/v. Has been having diarrhea.  Prior history of hysterectomy.  No urinary complaints.  Nothing making the pain better leading her to come to er. Denies fevers.  Past Medical History  Diagnosis Date  . Allergy     Past Surgical History  Procedure Laterality Date  . Cesarean section    . Abdominal hysterectomy      Family History  Problem Relation Age of Onset  . Heart disease Father   . Hypertension Father   . Stroke Father    Social History:  reports that she has never smoked. She does not have any smokeless tobacco history on file. She reports that she drinks about 3.6 oz of alcohol per week. Her drug history is not on file.  Allergies:  Allergies  Allergen Reactions  . Codeine Rash    Meds none  Results for orders placed or performed during the hospital encounter of 01/20/14 (from the past 48 hour(s))  CBC with Differential     Status: None   Collection Time: 01/20/14 12:44 PM  Result Value Ref Range   WBC 8.5 4.0 - 10.5 K/uL   RBC 4.81 3.87 - 5.11 MIL/uL   Hemoglobin 14.8 12.0 - 15.0 g/dL   HCT 43.9 36.0 - 46.0 %   MCV 91.3 78.0 - 100.0 fL   MCH 30.8 26.0 - 34.0 pg   MCHC 33.7 30.0 - 36.0 g/dL   RDW 12.6 11.5 - 15.5 %   Platelets 238 150 - 400 K/uL   Neutrophils Relative % 71 43 - 77 %   Neutro Abs 6.0 1.7 - 7.7 K/uL   Lymphocytes Relative 23 12 - 46 %   Lymphs Abs 1.9 0.7 - 4.0 K/uL   Monocytes Relative 5 3 - 12 %   Monocytes Absolute 0.5 0.1 - 1.0 K/uL   Eosinophils Relative 1 0 - 5 %   Eosinophils Absolute 0.0 0.0 - 0.7 K/uL   Basophils Relative 0 0 - 1 %   Basophils Absolute 0.0 0.0 - 0.1 K/uL  Comprehensive metabolic panel     Status: Abnormal   Collection Time: 01/20/14 12:44 PM  Result Value Ref Range   Sodium  134 (L) 137 - 147 mEq/L   Potassium 4.6 3.7 - 5.3 mEq/L    Comment: MODERATE HEMOLYSIS HEMOLYSIS AT THIS LEVEL MAY AFFECT RESULT    Chloride 97 96 - 112 mEq/L   CO2 21 19 - 32 mEq/L   Glucose, Bld 88 70 - 99 mg/dL   BUN 11 6 - 23 mg/dL   Creatinine, Ser 0.76 0.50 - 1.10 mg/dL   Calcium 9.4 8.4 - 10.5 mg/dL   Total Protein 7.9 6.0 - 8.3 g/dL   Albumin 4.0 3.5 - 5.2 g/dL   AST 34 0 - 37 U/L    Comment: MODERATE HEMOLYSIS HEMOLYSIS AT THIS LEVEL MAY AFFECT RESULT    ALT 31 0 - 35 U/L    Comment: MODERATE HEMOLYSIS HEMOLYSIS AT THIS LEVEL MAY AFFECT RESULT    Alkaline Phosphatase 66 39 - 117 U/L   Total Bilirubin 0.7 0.3 - 1.2 mg/dL   GFR calc non Af Amer >90 >90 mL/min   GFR calc Af Amer >90 >90 mL/min    Comment: (NOTE) The  eGFR has been calculated using the CKD EPI equation. This calculation has not been validated in all clinical situations. eGFR's persistently <90 mL/min signify possible Chronic Kidney Disease.    Anion gap 16 (H) 5 - 15  Lipase, blood     Status: None   Collection Time: 01/20/14 12:44 PM  Result Value Ref Range   Lipase 29 11 - 59 U/L  Urinalysis, Routine w reflex microscopic     Status: Abnormal   Collection Time: 01/20/14  1:26 PM  Result Value Ref Range   Color, Urine YELLOW YELLOW   APPearance CLOUDY (A) CLEAR   Specific Gravity, Urine 1.016 1.005 - 1.030   pH 5.5 5.0 - 8.0   Glucose, UA NEGATIVE NEGATIVE mg/dL   Hgb urine dipstick NEGATIVE NEGATIVE   Bilirubin Urine NEGATIVE NEGATIVE   Ketones, ur 15 (A) NEGATIVE mg/dL   Protein, ur NEGATIVE NEGATIVE mg/dL   Urobilinogen, UA 0.2 0.0 - 1.0 mg/dL   Nitrite NEGATIVE NEGATIVE   Leukocytes, UA NEGATIVE NEGATIVE    Comment: MICROSCOPIC NOT DONE ON URINES WITH NEGATIVE PROTEIN, BLOOD, LEUKOCYTES, NITRITE, OR GLUCOSE <1000 mg/dL.   Ct Abdomen Pelvis W Contrast  01/20/2014   CLINICAL DATA:  43 year old female with 3 day history of right lower quadrant pain  EXAM: CT ABDOMEN AND PELVIS WITH CONTRAST   TECHNIQUE: Multidetector CT imaging of the abdomen and pelvis was performed using the standard protocol following bolus administration of intravenous contrast.  CONTRAST:  2m OMNIPAQUE IOHEXOL 300 MG/ML SOLN, 1093mOMNIPAQUE IOHEXOL 300 MG/ML SOLN  COMPARISON:  Acute abdominal series performed earlier today  FINDINGS: Lower Chest: The lung bases are clear. Visualized cardiac structures are within normal limits for size. No pericardial effusion. Unremarkable visualized distal thoracic esophagus.  Abdomen: Unremarkable CT appearance of the stomach, duodenum, spleen, adrenal glands and pancreas. Normal hepatic contour and morphology. No discrete hepatic lesion. Gallbladder is unremarkable. No intra or extrahepatic biliary ductal dilatation. Unremarkable appearance of the bilateral kidneys. No focal solid lesion, hydronephrosis or nephrolithiasis.  Abnormal appearance of the appendix. There is an appendicolith in the appendiceal base. The surrounding appendiceal mucosa is thickened and enhancing and there is mild periappendiceal soft tissue stranding. Additionally, there is diffuse submucosal edema in the adjacent cecum. The terminal ileum is unremarkable.  Pelvis: Surgical changes of prior hysterectomy the ovaries are and high and slightly lateral positions bilaterally overlying the ileus psoas muscles suggesting a bilateral oophoropexy. The bladder is unremarkable. Trace free fluid in the pelvis may be physiologic or reactive/ inflammatory related to the appendiceal process.  Bones/Soft Tissues: No acute fracture or aggressive appearing lytic or blastic osseous lesion.  Vascular: No significant atherosclerotic vascular disease, aneurysmal dilatation or acute abnormality.  IMPRESSION: 1. Acute suppurative appendicitis with associated inflammatory submucosal edema of the base of the cecum. No evidence of perforation or abscess. 2. 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 Of note, the right  ovary is anatomically close to the inflamed appendix in the right lower quadrant. 3. Small volume free fluid in the cul-de-sac of DoNathaneil Canary may be physiologic, or reactive/inflammatory and related to the acute appendicitis. These results were called by telephone at the time of interpretation on 01/20/2014 at 2:22 pm to Dr. KRPryor Curia who verbally acknowledged these results.   Electronically Signed   By: HeJacqulynn Cadet.D.   On: 01/20/2014 14:23   Dg Abd Acute W/chest  01/20/2014   CLINICAL DATA:  [age]ear old female with acute exacerbation of chronic intermittent right  lower quadrant pain for the past 4 days.  EXAM: ACUTE ABDOMEN SERIES (ABDOMEN 2 VIEW & CHEST 1 VIEW)  COMPARISON:  Prior abdominal radiograph 07/24/2008  FINDINGS: Cardiac and mediastinal contours are within normal limits. The lungs are clear. No acute osseous abnormality. No pleural effusion, pneumothorax or suspicious pulmonary nodule or mass.  Nonobstructed bowel gas pattern. No evidence of free air or significant air-fluid levels on the upright view. No organomegaly or abnormal calcification identified. Osseous structures are unremarkable.  IMPRESSION: Negative abdominal radiographs.  No acute cardiopulmonary disease.   Electronically Signed   By: Jacqulynn Cadet M.D.   On: 01/20/2014 09:56    Review of Systems  Constitutional: Positive for malaise/fatigue. Negative for fever and chills.  Respiratory: Negative for shortness of breath.   Cardiovascular: Negative for chest pain.  Gastrointestinal: Positive for abdominal pain and diarrhea. Negative for nausea and vomiting.  Genitourinary: Negative for dysuria, urgency and frequency.    Blood pressure 151/95, pulse 89, temperature 98.9 F (37.2 C), temperature source Oral, resp. rate 16, weight 170 lb (77.111 kg), SpO2 100 %. Physical Exam  Vitals reviewed. Constitutional: She is oriented to person, place, and time. She appears well-developed and well-nourished.  HENT:   Head: Normocephalic and atraumatic.  Eyes: No scleral icterus.  Neck: Neck supple.  Cardiovascular: Normal rate, regular rhythm and normal heart sounds.   Respiratory: Effort normal and breath sounds normal. She has no wheezes. She has no rales.  GI: Soft. Bowel sounds are normal. She exhibits no distension. There is tenderness. There is tenderness at McBurney's point. No hernia.  Lymphadenopathy:    She has no cervical adenopathy.  Neurological: She is alert and oriented to person, place, and time.     Assessment/Plan Likely acute appendicitis  She appears clinically and radiologically to have acute appendicitis.  WBC is normal but she does have rlq tenderness.  appendicolith present. We discussed options including abx vs surgery. With degree of pain she has and appendicolith I recommended proceeding to surgery.  Discussed lap appy.  We discussed possibility of rupture and length of stay after that.  Risks include but not limited to ileocecectomy, open procedure, bleeding, infection, postop abscess requiring treatment.  She has been given zosyn by the ed.  Will go to or when available.  Omarii Scalzo 01/20/2014, 3:35 PM

## 2014-01-20 NOTE — ED Notes (Signed)
Pt states abdominal pain and diarrhea x 3 days. Pain RUQ and RLQ 9/10 intermittent and with movement, pain with bowel movement. Pt at urgent care this morning, xr done, provider told patient something "showed up on XR and would need to go to ED for CT scan."

## 2014-01-20 NOTE — Anesthesia Postprocedure Evaluation (Signed)
  Anesthesia Post-op Note  Patient: Hannah Farmer  Procedure(s) Performed: Procedure(s) (LRB): APPENDECTOMY LAPAROSCOPIC (N/A)  Patient Location: PACU  Anesthesia Type: General  Level of Consciousness: awake and alert   Airway and Oxygen Therapy: Patient Spontanous Breathing  Post-op Pain: mild  Post-op Assessment: Post-op Vital signs reviewed, Patient's Cardiovascular Status Stable, Respiratory Function Stable, Patent Airway and No signs of Nausea or vomiting  Last Vitals:  Filed Vitals:   01/20/14 1327  BP: 151/95  Pulse: 89  Temp:   Resp: 16    Post-op Vital Signs: stable   Complications: No apparent anesthesia complications

## 2014-01-20 NOTE — Transfer of Care (Signed)
Immediate Anesthesia Transfer of Care Note  Patient: Hannah Farmer  Procedure(s) Performed: Procedure(s): APPENDECTOMY LAPAROSCOPIC (N/A)  Patient Location: PACU  Anesthesia Type:General  Level of Consciousness: awake, alert  and oriented  Airway & Oxygen Therapy: Patient Spontanous Breathing and Patient connected to face mask oxygen  Post-op Assessment: Report given to PACU RN and Post -op Vital signs reviewed and stable  Post vital signs: Reviewed and stable  Complications: No apparent anesthesia complications

## 2014-01-20 NOTE — Anesthesia Preprocedure Evaluation (Addendum)
Anesthesia Evaluation  Patient identified by MRN, date of birth, ID band Patient awake    Reviewed: Allergy & Precautions, H&P , NPO status , Patient's Chart, lab work & pertinent test results  History of Anesthesia Complications (+) PONV and history of anesthetic complications  Airway Mallampati: II  TM Distance: >3 FB Neck ROM: Full    Dental no notable dental hx. (+) Teeth Intact, Dental Advisory Given   Pulmonary neg pulmonary ROS,  breath sounds clear to auscultation  Pulmonary exam normal       Cardiovascular Exercise Tolerance: Good negative cardio ROS  Rhythm:Regular Rate:Normal     Neuro/Psych negative neurological ROS  negative psych ROS   GI/Hepatic negative GI ROS, Neg liver ROS,   Endo/Other  negative endocrine ROS  Renal/GU negative Renal ROS  negative genitourinary   Musculoskeletal negative musculoskeletal ROS (+)   Abdominal   Peds negative pediatric ROS (+)  Hematology negative hematology ROS (+)   Anesthesia Other Findings   Reproductive/Obstetrics negative OB ROS                             Anesthesia Physical Anesthesia Plan  ASA: I  Anesthesia Plan: General   Post-op Pain Management:    Induction: Intravenous, Rapid sequence and Cricoid pressure planned  Airway Management Planned: Oral ETT  Additional Equipment:   Intra-op Plan:   Post-operative Plan: Extubation in OR  Informed Consent: I have reviewed the patients History and Physical, chart, labs and discussed the procedure including the risks, benefits and alternatives for the proposed anesthesia with the patient or authorized representative who has indicated his/her understanding and acceptance.   Dental advisory given  Plan Discussed with: CRNA  Anesthesia Plan Comments:        Anesthesia Quick Evaluation

## 2014-01-20 NOTE — ED Provider Notes (Signed)
TIME SEEN: 12:12 PM  CHIEF COMPLAINT: abdominal pain, diarrhea  HPI: Pt is a 43 y.o. F with no significant past history who presents emergency department 3 days of right middle to right lower abdominal pain and diarrhea. Patient states she was seen in urgent care today and had an x-ray that she reports the physician told her was concerning. She was sent to the emergency department for further evaluation. She denies any fevers, chills, nausea, vomiting, dysuria or hematuria, vaginal bleeding or discharge. She is status post partial hysterectomy, C-section. No sick contacts or recent travel.  ROS: See HPI Constitutional: no fever  Eyes: no drainage  ENT: no runny nose   Cardiovascular:  no chest pain  Resp: no SOB  GI: no vomiting GU: no dysuria Integumentary: no rash  Allergy: no hives  Musculoskeletal: no leg swelling  Neurological: no slurred speech ROS otherwise negative  PAST MEDICAL HISTORY/PAST SURGICAL HISTORY:  Past Medical History  Diagnosis Date  . Allergy     MEDICATIONS:  Prior to Admission medications   Medication Sig Start Date End Date Taking? Authorizing Provider  Ibuprofen 200 MG CAPS Take 400 mg by mouth daily as needed (pain).   Yes Historical Provider, MD  Naproxen Sodium (ALEVE) 220 MG CAPS Take 440 mg by mouth daily as needed (pain).   Yes Historical Provider, MD    ALLERGIES:  Allergies  Allergen Reactions  . Codeine Rash    SOCIAL HISTORY:  History  Substance Use Topics  . Smoking status: Never Smoker   . Smokeless tobacco: Not on file  . Alcohol Use: 3.6 oz/week    0 Not specified, 6 Glasses of wine per week    FAMILY HISTORY: Family History  Problem Relation Age of Onset  . Heart disease Father   . Hypertension Father   . Stroke Father     EXAM: BP 149/103 mmHg  Pulse 87  Temp(Src) 98.9 F (37.2 C) (Oral)  Resp 16  Wt 170 lb (77.111 kg)  SpO2 100% CONSTITUTIONAL: Alert and oriented and responds appropriately to questions.  Well-appearing; well-nourished HEAD: Normocephalic EYES: Conjunctivae clear, PERRL ENT: normal nose; no rhinorrhea; moist mucous membranes; pharynx without lesions noted NECK: Supple, no meningismus, no LAD  CARD: RRR; S1 and S2 appreciated; no murmurs, no clicks, no rubs, no gallops RESP: Normal chest excursion without splinting or tachypnea; breath sounds clear and equal bilaterally; no wheezes, no rhonchi, no rales,  ABD/GI: Normal bowel sounds; non-distended; soft,tender to palpation of the right lower quadrant at McBurney's point with voluntary guarding, no rebound, no tenderness in the epigastric or right upper quadrant, negative Murphy sign, no peritoneal signs BACK:  The back appears normal and is non-tender to palpation, there is no CVA tenderness EXT: Normal ROM in all joints; non-tender to palpation; no edema; normal capillary refill; no cyanosis    SKIN: Normal color for age and race; warm NEURO: Moves all extremities equally PSYCH: The patient's mood and manner are appropriate. Grooming and personal hygiene are appropriate.  MEDICAL DECISION MAKING: Pt here with right middle to right lower quadrant abdominal pain. Concern for appendicitis, UTI, pyelonephritis, colitis, less likely pelvic in nature given how high her pain is. We'll obtain labs, urine, CT of her abdomen and pelvis. We'll give IV fentanyl, Zofran, IV fluids.  ED PROGRESS: 2:24 PM  Contacted by radiologist, patient has acute appendicitis without perforation or abscess. We'll contact surgery. We'll keep nothing by mouth, start IV maintenance fluids and given IV Zosyn.   2:41  PM  D/w Dr. Dwain SarnaWakefield with general surgery for admission for appendicitis. Patient and family updated with plan. She has been nothing by mouth since 9 AM.  Layla MawKristen N Ward, DO 01/20/14 [phone]

## 2014-01-20 NOTE — Progress Notes (Signed)
Subjective:    Patient ID: Hannah Farmer, female    DOB: [birth year], 43 y.o.   MRN: [id number] This chart was scribed for Ellamae Siaobert Jolynne Spurgin, MD by Jolene Provostobert Halas, Medical Scribe. This patient was seen in Room 13 and the patient's care was started a 8:58 AM.   HPI HPI Comments: Hannah Farmer is a 43 y.o. female who [facility] complaining of an acute exacerbation of chronic intermittent RLQ abdominal pain that started four days ago when she started with diarrhea. Pt states that she has had constant severe RLQ abdominal pain for the last 12hours,couldn't sleep. Worse with movement.  Pt denies fever, chills, nausea, blood in stool, changes in diet, dysuria, urinary frequency or hesitancy. Pt endorses appetite change, severe diarrhea, and moderate pain with deep inspiration.  Pt denies past hx of appendectomy or cholecystectomy. Pt has a past history of subtotal hysterectomy-has both ovaries.   No current med probs//on no meds  Review of Systems  Constitutional: Negative for chills, diaphoresis, fatigue and unexpected weight change.  HENT: Negative for trouble swallowing.   Respiratory: Negative for cough, choking, chest tightness and shortness of breath.   Cardiovascular: Negative for chest pain, palpitations and leg swelling.  Gastrointestinal: Positive for abdominal pain. Negative for blood in stool, abdominal distention and anal bleeding.  Genitourinary: Negative for dysuria, frequency, hematuria, difficulty urinating and menstrual problem.  Musculoskeletal: Negative for back pain.  Skin: Negative for rash.  Neurological: Negative for headaches.  Hematological: Negative for adenopathy. Does not bruise/bleed easily.       Objective:   Physical Exam  Constitutional: She is oriented to person, place, and time. She appears well-developed and well-nourished.  HENT:  Head: Normocephalic and atraumatic.  Eyes: Pupils are equal, round, and reactive to light.  Neck: Neck supple.   Cardiovascular: Normal rate and regular rhythm.   Pulmonary/Chest: Effort normal and breath sounds normal. No respiratory distress. She has no wheezes.  Pain with deep inspiration.  Abdominal: She exhibits no distension and no mass. Bowel sounds are decreased. There is no hepatosplenomegaly. There is tenderness in the right lower quadrant. There is guarding.  Marked TTP in right middle quadrant, and RLQ.  Right CVA tenderness to percussion radiating to RLQ.   Neurological: She is alert and oriented to person, place, and time.  Skin: Skin is warm and dry.  Psychiatric: She has a normal mood and affect. Her behavior is normal.  Nursing note and vitals reviewed.  Abdominal x-ray read by Dr. Ellamae Siaobert Armonii Sieh, suggests obstruction on right right with air fluid levels. Radiology interp = not obstructed Results for orders placed or performed in visit on 01/20/14  POCT CBC  Result Value Ref Range   WBC 9.6 4.6 - 10.2 K/uL   Lymph, poc 1.9 0.6 - 3.4   POC LYMPH PERCENT 19.8 10 - 50 %L   MID (cbc) 0.4 0 - 0.9   POC MID % 4.3 0 - 12 %M   POC Granulocyte 7.3 (A) 2 - 6.9   Granulocyte percent 75.9 37 - 80 %G   RBC 4.98 4.04 - 5.48 M/uL   Hemoglobin 15.5 12.2 - 16.2 g/dL   HCT, POC 69.646.4 29.537.7 - 47.9 %   MCV 93.2 80 - 97 fL   MCH, POC 31.2 27 - 31.2 pg   MCHC 33.5 31.8 - 35.4 g/dL   RDW, POC 28.412.8 %   Platelet Count, POC 315 142 - 424 K/uL   MPV 6.9 0 - 99.8 fL  POCT UA - Microscopic Only  Result Value Ref Range   WBC, Ur, HPF, POC neg    RBC, urine, microscopic neg    Bacteria, U Microscopic neg    Mucus, UA neg    Epithelial cells, urine per micros 0-3    Crystals, Ur, HPF, POC neg    Casts, Ur, LPF, POC neg    Yeast, UA neg   POCT urinalysis dipstick  Result Value Ref Range   Color, UA yellow    Clarity, UA clear    Glucose, UA neg    Bilirubin, UA neg    Ketones, UA neg    Spec Grav, UA 1.020    Blood, UA neg    pH, UA 5.0    Protein, UA neg    Urobilinogen, UA 0.2     Nitrite, UA neg    Leukocytes, UA Negative        Assessment & Plan:   I have completed the patient encounter in its entirety as documented by the scribe, with editing by me where necessary. Acacia Latorre P. Merla Richesoolittle, M.D.   RLQ abdominal pain -marked rebound and percussion tenderness To [facility] for emergent CT---diff incl appedicitis-atypical/colitis/enteritis/adhesions with partial obstruction/ovarian pathology

## 2014-01-21 ENCOUNTER — Encounter (HOSPITAL_COMMUNITY): Payer: Self-pay | Admitting: *Deleted

## 2014-01-21 DIAGNOSIS — K352 Acute appendicitis with generalized peritonitis: Secondary | ICD-10-CM | POA: Diagnosis present

## 2014-01-21 DIAGNOSIS — R1031 Right lower quadrant pain: Secondary | ICD-10-CM | POA: Diagnosis present

## 2014-01-21 DIAGNOSIS — Z90710 Acquired absence of both cervix and uterus: Secondary | ICD-10-CM | POA: Diagnosis not present

## 2014-01-21 MED ORDER — OXYCODONE HCL 5 MG PO TABS
5.0000 mg | ORAL_TABLET | ORAL | Status: DC | PRN
  Administered 2014-01-21 – 2014-01-22 (×7): 10 mg via ORAL
  Administered 2014-01-23: 5 mg via ORAL
  Administered 2014-01-23: 10 mg via ORAL
  Filled 2014-01-21 (×9): qty 2

## 2014-01-21 NOTE — Plan of Care (Signed)
Problem: Phase I Progression Outcomes Goal: Pain controlled with appropriate interventions Outcome: Completed/Met Date Met:  01/21/14 Goal: OOB as tolerated unless otherwise ordered Outcome: Completed/Met Date Met:  01/21/14 Goal: Incision/dressings dry and intact Outcome: Completed/Met Date Met:  01/21/14 Goal: Tubes/drains patent Outcome: Completed/Met Date Met:  01/21/14 Goal: Initial discharge plan identified Outcome: Completed/Met Date Met:  01/21/14 Goal: Voiding-avoid urinary catheter unless indicated Outcome: Completed/Met Date Met:  01/21/14 Goal: Vital signs/hemodynamically stable Outcome: Completed/Met Date Met:  01/21/14 Goal: Other Phase I Outcomes/Goals Outcome: Completed/Met Date Met:  01/21/14  Problem: Phase II Progression Outcomes Goal: Pain controlled Outcome: Completed/Met Date Met:  01/21/14 Goal: Progress activity as tolerated unless otherwise ordered Outcome: Completed/Met Date Met:  01/21/14 Goal: Progressing with IS, TCDB Outcome: Completed/Met Date Met:  01/21/14 Goal: Vital signs stable Outcome: Completed/Met Date Met:  01/21/14

## 2014-01-21 NOTE — Progress Notes (Signed)
1 Day Post-Op  Subjective: She is having pain, Farmer little nausea, no flatus.  Very tender.  Drain is clear.  Objective: Vital signs in last 24 hours: Temp:  [97.5 F (36.4 C)-99.5 F (37.5 C)] 98.4 F (36.9 C) (11/16 1000) Pulse Rate:  [62-89] 73 (11/16 1000) Resp:  [11-18] 18 (11/16 1000) BP: (103-151)/(56-103) 107/56 mmHg (11/16 1000) SpO2:  [97 %-100 %] 97 % (11/16 1000) Weight:  [77.111 kg (170 lb)] 77.111 kg (170 lb) (11/15 1834)  240 Po Afebrile,  No labs Intake/Output from previous day: 11/15 0701 - 11/16 0700 In: 2760 [P.O.:960; I.V.:1800] Out: 1210 [Urine:1050; Drains:110; Blood:50] Intake/Output this shift: Total I/O In: 1540 [P.O.:240; I.V.:1200; IV Piggyback:100] Out: 1005 [Urine:1000; Drains:5]  General appearance: alert, cooperative, no distress and having allot of discomfort. Resp: clear to auscultation bilaterally GI: soft very tender, and having pain.  Lab Results:   Recent Labs  01/20/14 0913 01/20/14 1244  WBC 9.6 8.5  HGB 15.5 14.8  HCT 46.4 43.9  PLT  --  238    BMET  Recent Labs  01/20/14 0907 01/20/14 1244  NA 137 134*  K 3.8 4.6  CL 101 97  CO2 25 21  GLUCOSE 104* 88  BUN 11 11  CREATININE 0.80 0.76  CALCIUM 9.2 9.4   PT/INR No results for input(s): LABPROT, INR in the last 72 hours.   Recent Labs Lab 01/20/14 0907 01/20/14 1244  AST 27 34  ALT 31 31  ALKPHOS 65 66  BILITOT 0.7 0.7  PROT 7.8 7.9  ALBUMIN 4.8 4.0     Lipase     Component Value Date/Time   LIPASE 29 01/20/2014 1244     Studies/Results: Ct Abdomen Pelvis W Contrast  01/20/2014   CLINICAL DATA:  43 year old female with 3 day history of right lower quadrant pain  EXAM: CT ABDOMEN AND PELVIS WITH CONTRAST  TECHNIQUE: Multidetector CT imaging of the abdomen and pelvis was performed using the standard protocol following bolus administration of intravenous contrast.  CONTRAST:  50mL OMNIPAQUE IOHEXOL 300 MG/ML SOLN, 100mL OMNIPAQUE IOHEXOL 300 MG/ML SOLN   COMPARISON:  Acute abdominal series performed earlier today  FINDINGS: Lower Chest: The lung bases are clear. Visualized cardiac structures are within normal limits for size. No pericardial effusion. Unremarkable visualized distal thoracic esophagus.  Abdomen: Unremarkable CT appearance of the stomach, duodenum, spleen, adrenal glands and pancreas. Normal hepatic contour and morphology. No discrete hepatic lesion. Gallbladder is unremarkable. No intra or extrahepatic biliary ductal dilatation. Unremarkable appearance of the bilateral kidneys. No focal solid lesion, hydronephrosis or nephrolithiasis.  Abnormal appearance of the appendix. There is an appendicolith in the appendiceal base. The surrounding appendiceal mucosa is thickened and enhancing and there is mild periappendiceal soft tissue stranding. Additionally, there is diffuse submucosal edema in the adjacent cecum. The terminal ileum is unremarkable.  Pelvis: Surgical changes of prior hysterectomy the ovaries are and high and slightly lateral positions bilaterally overlying the ileus psoas muscles suggesting Farmer bilateral oophoropexy. The bladder is unremarkable. Trace free fluid in the pelvis may be physiologic or reactive/ inflammatory related to the appendiceal process.  Bones/Soft Tissues: No acute fracture or aggressive appearing lytic or blastic osseous lesion.  Vascular: No significant atherosclerotic vascular disease, aneurysmal dilatation or acute abnormality.  IMPRESSION: 1. Acute suppurative appendicitis with associated inflammatory submucosal edema of the base of the cecum. No evidence of perforation or abscess. 2. 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 Of note, the right ovary is anatomically close  to the inflamed appendix in the right lower quadrant. 3. Small volume free fluid in the cul-de-sac of Hannah Farmer may be physiologic, or reactive/inflammatory and related to the acute appendicitis. These results were called by  telephone at the time of interpretation on 01/20/2014 at 2:22 pm to Hannah Farmer , who verbally acknowledged these results.   Electronically Signed   By: Hannah MoanHeath  Farmer M.D.   On: 01/20/2014 14:23   Dg Abd Acute W/chest  01/20/2014   CLINICAL DATA:  43 year old female with acute exacerbation of chronic intermittent right lower quadrant pain for the past 4 days.  EXAM: ACUTE ABDOMEN SERIES (ABDOMEN 2 VIEW & CHEST 1 VIEW)  COMPARISON:  Prior abdominal radiograph 07/24/2008  FINDINGS: Cardiac and mediastinal contours are within normal limits. The lungs are clear. No acute osseous abnormality. No pleural effusion, pneumothorax or suspicious pulmonary nodule or mass.  Nonobstructed bowel gas pattern. No evidence of free air or significant air-fluid levels on the upright view. No organomegaly or abnormal calcification identified. Osseous structures are unremarkable.  IMPRESSION: Negative abdominal radiographs.  No acute cardiopulmonary disease.   Electronically Signed   By: Hannah MoanHeath  Farmer M.D.   On: 01/20/2014 09:56    Medications: . heparin  5,000 Units Subcutaneous 3 times per day  . piperacillin-tazobactam (ZOSYN)  IV  3.375 g Intravenous Q8H    Assessment/Plan Ruptured appendicitis Laparoscopic appendectomy, 01/20/14, Dr Harden MoMatt [place]    Plan:  Leave her on clears, continue antibiotics, hydrate, check labs in AM and mobilize.     LOS: 1 day    Hannah Farmer 01/21/2014

## 2014-01-21 NOTE — Progress Notes (Signed)
CARE MANAGEMENT NOTE 01/21/2014  Patient:  Burman NievesCOMBS,Coty K   Account Number:  [id number]  Date Initiated:  01/21/2014  Documentation initiated by:  Lanier ClamMAHABIR,Jaeleigh Monaco  Subjective/Objective Assessment:   43 Y/O F ADMITTED W/ACUTE APPENDICITIS.     Action/Plan:   FROM HOME.   Anticipated DC Date:  01/23/2014   Anticipated DC Plan:  HOME/SELF CARE      DC Planning Services  CM consult      Choice offered to / List presented to:             Status of service:  In process, will continue to follow Medicare Important Message given?   (If response is "NO", the following Medicare IM given date fields will be blank) Date Medicare IM given:   Medicare IM given by:   Date Additional Medicare IM given:   Additional Medicare IM given by:    Discharge Disposition:    Per UR Regulation:  Reviewed for med. necessity/level of care/duration of stay  If discussed at Long Length of Stay Meetings, dates discussed:    Comments:  01/21/14 Charlane Westry RN,BSN NCM [phone] POD#1 LAP APPY, DRAIN,IV ABX.NO ANTICIPATED D/C NEEDS.

## 2014-01-21 NOTE — Progress Notes (Signed)
UR completed 

## 2014-01-22 ENCOUNTER — Inpatient Hospital Stay (HOSPITAL_COMMUNITY): Payer: 59 | Admitting: Anesthesiology

## 2014-01-22 LAB — CBC
HCT: 38.7 % (ref 36.0–46.0)
Hemoglobin: 13 g/dL (ref 12.0–15.0)
MCH: 30.7 pg (ref 26.0–34.0)
MCHC: 33.6 g/dL (ref 30.0–36.0)
MCV: 91.5 fL (ref 78.0–100.0)
PLATELETS: 238 10*3/uL (ref 150–400)
RBC: 4.23 MIL/uL (ref 3.87–5.11)
RDW: 12.6 % (ref 11.5–15.5)
WBC: 8 10*3/uL (ref 4.0–10.5)

## 2014-01-22 LAB — BASIC METABOLIC PANEL
ANION GAP: 12 (ref 5–15)
BUN: 11 mg/dL (ref 6–23)
CHLORIDE: 104 meq/L (ref 96–112)
CO2: 23 mEq/L (ref 19–32)
Calcium: 8.6 mg/dL (ref 8.4–10.5)
Creatinine, Ser: 0.83 mg/dL (ref 0.50–1.10)
GFR calc Af Amer: >90 mL/min (ref >90)
GFR calc non Af Amer: 85 mL/min — ABNORMAL LOW (ref >90)
Glucose, Bld: 114 mg/dL — ABNORMAL HIGH (ref 70–99)
POTASSIUM: 4.1 meq/L (ref 3.7–5.3)
Sodium: 139 mEq/L (ref 137–147)

## 2014-01-22 NOTE — Progress Notes (Signed)
2 Days Post-Op  Subjective: She isn't passing gas, taking some PO's, walking, but still feels bad.  Pain better with PO med.  Objective: Vital signs in last 24 hours: Temp:  [98.3 F (36.8 C)-98.9 F (37.2 C)] 98.7 F (37.1 C) (11/17 0615) Pulse Rate:  [73-90] 90 (11/17 0615) Resp:  [18] 18 (11/17 0615) BP: (107-128)/(56-78) 116/76 mmHg (11/17 0615) SpO2:  [96 %-99 %] 97 % (11/17 0615)   40 from the drain 1560 PO=Afebrile, VSSLabs OK Intake/Output from previous day: 11/16 0701 - 11/17 0700 In: 4850 [P.O.:1560; I.V.:3190; IV Piggyback:100] Out: 5340 [Urine:5300; Drains:40] Intake/Output this shift:    General appearance: alert, cooperative and no distress Resp: clear to auscultation bilaterally GI: sore, rare bowel sounds, no flautus or BM  Lab Results:   Recent Labs  01/20/14 1244 01/22/14 0525  WBC 8.5 8.0  HGB 14.8 13.0  HCT 43.9 38.7  PLT 238 238    BMET  Recent Labs  01/20/14 1244 01/22/14 0525  NA 134* 139  K 4.6 4.1  CL 97 104  CO2 21 23  GLUCOSE 88 114*  BUN 11 11  CREATININE 0.76 0.83  CALCIUM 9.4 8.6   PT/INR No results for input(s): LABPROT, INR in the last 72 hours.   Recent Labs Lab 01/20/14 0907 01/20/14 1244  AST 27 34  ALT 31 31  ALKPHOS 65 66  BILITOT 0.7 0.7  PROT 7.8 7.9  ALBUMIN 4.8 4.0     Lipase     Component Value Date/Time   LIPASE 29 01/20/2014 1244     Studies/Results: Ct Abdomen Pelvis W Contrast  01/20/2014   CLINICAL DATA:  43 year old female with 3 day history of right lower quadrant pain  EXAM: CT ABDOMEN AND PELVIS WITH CONTRAST  TECHNIQUE: Multidetector CT imaging of the abdomen and pelvis was performed using the standard protocol following bolus administration of intravenous contrast.  CONTRAST:  50mL OMNIPAQUE IOHEXOL 300 MG/ML SOLN, 100mL OMNIPAQUE IOHEXOL 300 MG/ML SOLN  COMPARISON:  Acute abdominal series performed earlier today  FINDINGS: Lower Chest: The lung bases are clear. Visualized cardiac  structures are within normal limits for size. No pericardial effusion. Unremarkable visualized distal thoracic esophagus.  Abdomen: Unremarkable CT appearance of the stomach, duodenum, spleen, adrenal glands and pancreas. Normal hepatic contour and morphology. No discrete hepatic lesion. Gallbladder is unremarkable. No intra or extrahepatic biliary ductal dilatation. Unremarkable appearance of the bilateral kidneys. No focal solid lesion, hydronephrosis or nephrolithiasis.  Abnormal appearance of the appendix. There is an appendicolith in the appendiceal base. The surrounding appendiceal mucosa is thickened and enhancing and there is mild periappendiceal soft tissue stranding. Additionally, there is diffuse submucosal edema in the adjacent cecum. The terminal ileum is unremarkable.  Pelvis: Surgical changes of prior hysterectomy the ovaries are and high and slightly lateral positions bilaterally overlying the ileus psoas muscles suggesting a bilateral oophoropexy. The bladder is unremarkable. Trace free fluid in the pelvis may be physiologic or reactive/ inflammatory related to the appendiceal process.  Bones/Soft Tissues: No acute fracture or aggressive appearing lytic or blastic osseous lesion.  Vascular: No significant atherosclerotic vascular disease, aneurysmal dilatation or acute abnormality.  IMPRESSION: 1. Acute suppurative appendicitis with associated inflammatory submucosal edema of the base of the cecum. No evidence of perforation or abscess. 2. 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 Of note, the right ovary is anatomically close to the inflamed appendix in the right lower quadrant. 3. Small volume free fluid in the cul-de-sac of Douglas a  may be physiologic, or reactive/inflammatory and related to the acute appendicitis. These results were called by telephone at the time of interpretation on 01/20/2014 at 2:22 pm to Dr. Rochele RaringKRISTEN WARD , who verbally acknowledged these results.    Electronically Signed   By: Malachy MoanHeath  McCullough M.D.   On: 01/20/2014 14:23   Dg Abd Acute W/chest  01/20/2014   CLINICAL DATA:  43 year old female with acute exacerbation of chronic intermittent right lower quadrant pain for the past 4 days.  EXAM: ACUTE ABDOMEN SERIES (ABDOMEN 2 VIEW & CHEST 1 VIEW)  COMPARISON:  Prior abdominal radiograph 07/24/2008  FINDINGS: Cardiac and mediastinal contours are within normal limits. The lungs are clear. No acute osseous abnormality. No pleural effusion, pneumothorax or suspicious pulmonary nodule or mass.  Nonobstructed bowel gas pattern. No evidence of free air or significant air-fluid levels on the upright view. No organomegaly or abnormal calcification identified. Osseous structures are unremarkable.  IMPRESSION: Negative abdominal radiographs.  No acute cardiopulmonary disease.   Electronically Signed   By: Malachy MoanHeath  McCullough M.D.   On: 01/20/2014 09:56    Medications: . heparin  5,000 Units Subcutaneous 3 times per day  . piperacillin-tazobactam (ZOSYN)  IV  3.375 g Intravenous Q8H    Assessment/Plan Ruptured appendicitis Laparoscopic appendectomy, 01/20/14, Dr Harden MoMatt [place]   Plan:  Big issue is they cannot get an IV back in her.  I will ask anesthesia to see if they can get something.  I don't think she is ready to rely on PO meds, and transition to Po antibiotics yet.  If Anesthesia fails, we may have to try.  I don't think she is going to be here long enough to warrant a PICC line.     LOS: 2 days    Alekzander Cardell 01/22/2014

## 2014-01-22 NOTE — Plan of Care (Signed)
Problem: Phase II Progression Outcomes Goal: Surgical site without signs of infection Outcome: Completed/Met Date Met:  01/22/14 Goal: Dressings dry/intact Outcome: Completed/Met Date Met:  01/22/14 Goal: Return of bowel function (flatus, BM) IF ABDOMINAL SURGERY:  Outcome: Progressing

## 2014-01-23 ENCOUNTER — Encounter: Payer: Self-pay | Admitting: General Surgery

## 2014-01-23 LAB — CBC
HEMATOCRIT: 39 % (ref 36.0–46.0)
Hemoglobin: 12.7 g/dL (ref 12.0–15.0)
MCH: 30.4 pg (ref 26.0–34.0)
MCHC: 32.6 g/dL (ref 30.0–36.0)
MCV: 93.3 fL (ref 78.0–100.0)
Platelets: 259 10*3/uL (ref 150–400)
RBC: 4.18 MIL/uL (ref 3.87–5.11)
RDW: 12.4 % (ref 11.5–15.5)
WBC: 7.9 10*3/uL (ref 4.0–10.5)

## 2014-01-23 MED ORDER — SACCHAROMYCES BOULARDII 250 MG PO CAPS
250.0000 mg | ORAL_CAPSULE | Freq: Two times a day (BID) | ORAL | Status: DC
  Filled 2014-01-23: qty 1

## 2014-01-23 MED ORDER — AMOXICILLIN-POT CLAVULANATE 875-125 MG PO TABS
1.0000 | ORAL_TABLET | Freq: Two times a day (BID) | ORAL | Status: DC
  Administered 2014-01-23: 1 via ORAL
  Filled 2014-01-23 (×2): qty 1

## 2014-01-23 MED ORDER — AMOXICILLIN-POT CLAVULANATE 875-125 MG PO TABS: 1.0000 | ORAL_TABLET | Freq: Two times a day (BID) | ORAL | Status: DC

## 2014-01-23 MED ORDER — FLUCONAZOLE 150 MG PO TABS
150.0000 mg | ORAL_TABLET | Freq: Every day | ORAL | Status: DC
  Administered 2014-01-23: 150 mg via ORAL
  Filled 2014-01-23: qty 1

## 2014-01-23 MED ORDER — FLUCONAZOLE 150 MG PO TABS: 150.0000 mg | ORAL_TABLET | Freq: Every day | ORAL | Status: DC

## 2014-01-23 MED ORDER — OXYCODONE HCL 5 MG PO TABS: 5.0000 mg | ORAL_TABLET | ORAL | Status: DC | PRN

## 2014-01-23 MED ORDER — SACCHAROMYCES BOULARDII 250 MG PO CAPS: ORAL_CAPSULE | ORAL | Status: DC

## 2014-01-23 MED ORDER — ACETAMINOPHEN 325 MG PO TABS: 650.0000 mg | ORAL_TABLET | Freq: Four times a day (QID) | ORAL | Status: DC | PRN

## 2014-01-23 NOTE — Plan of Care (Signed)
Problem: Discharge Progression Outcomes Goal: Barriers To Progression Addressed/Resolved Outcome: Completed/Met Date Met:  01/23/14 Goal: Discharge plan in place and appropriate Outcome: Completed/Met Date Met:  01/23/14 Goal: Pain controlled with appropriate interventions Outcome: Completed/Met Date Met:  01/23/14 Goal: Hemodynamically stable Outcome: Completed/Met Date Met:  01/05/12 Goal: Complications resolved/controlled Outcome: Completed/Met Date Met:  01/23/14 Goal: Tolerating diet Outcome: Completed/Met Date Met:  01/23/14 Goal: Activity appropriate for discharge plan Outcome: Completed/Met Date Met:  01/23/14 Goal: Tubes and drains discontinued if indicated Outcome: Completed/Met Date Met:  01/23/14 Goal: Other Discharge Outcomes/Goals Outcome: Not Applicable Date Met:  14/38/88

## 2014-01-23 NOTE — Progress Notes (Signed)
3 Days Post-Op  Subjective: Sore but passing gas and wants to go home.  Drain serous like fluid in tube, drain is empty now.    Objective: Vital signs in last 24 hours: Temp:  [98.5 F (36.9 C)-99.6 F (37.6 C)] 98.5 F (36.9 C) (11/18 0630) Pulse Rate:  [80-81] 81 (11/17 2140) Resp:  [18] 18 (11/18 0630) BP: (122-133)/(78-85) 122/85 mmHg (11/18 0630) SpO2:  [95 %-97 %] 96 % (11/18 0630)  1320 PO 100 ml from the drain 480 stool  Afrebrile, VSS WBC 7.9   Intake/Output from previous day: 11/17 0701 - 11/18 0700 In: 2130 [P.O.:1320; I.V.:810] Out: 3880 [Urine:3300; Drains:100; Stool:480] Intake/Output this shift: Total I/O In: -  Out: 720 [Urine:700; Drains:20]  General appearance: alert, cooperative and no distress GI: soft, still very sore.  sites look good drain is clear.  Lab Results:   Recent Labs  01/22/14 0525 01/23/14 0432  WBC 8.0 7.9  HGB 13.0 12.7  HCT 38.7 39.0  PLT 238 259    BMET  Recent Labs  01/20/14 1244 01/22/14 0525  NA 134* 139  K 4.6 4.1  CL 97 104  CO2 21 23  GLUCOSE 88 114*  BUN 11 11  CREATININE 0.76 0.83  CALCIUM 9.4 8.6   PT/INR No results for input(s): LABPROT, INR in the last 72 hours.   Recent Labs Lab 01/20/14 0907 01/20/14 1244  AST 27 34  ALT 31 31  ALKPHOS 65 66  BILITOT 0.7 0.7  PROT 7.8 7.9  ALBUMIN 4.8 4.0     Lipase     Component Value Date/Time   LIPASE 29 01/20/2014 1244     Studies/Results: No results found.  Medications: . heparin  5,000 Units Subcutaneous 3 times per day  . piperacillin-tazobactam (ZOSYN)  IV  3.375 g Intravenous Q8H    Assessment/Plan Ruptured appendicitis Laparoscopic appendectomy, 01/20/14, Dr Harden MoMatt [place]   Plan:  Give her some regular food, try PO antibiotic and  See how she does, Check on pulling drain and home later today. Drain removed, OK for D/c she would like diflucan she has yeast infections with most antibiotics.  LOS: 3 days     Maximillion Gill 01/23/2014

## 2014-01-23 NOTE — Progress Notes (Signed)
Patient is alert and oriented, vital signs are stable, incisions within normal limits, patient tolerating her diet without complaints of nausea or vomiting, patient to follow up with MD within 2 weeks, prescriptions given and concerns answered Stanford BreedBracey, Reilly Molchan N RN 01-23-2014 17:04pm

## 2014-01-23 NOTE — Discharge Instructions (Signed)
Laparoscopic Appendectomy Care After Your appendix was ruptured, call if you have any problems after going home. Refer to this sheet in the next few weeks. These instructions provide you with information on caring for yourself after your procedure. Your caregiver may also give you more specific instructions. Your treatment has been planned according to current medical practices, but problems sometimes occur. Call your caregiver if you have any problems or questions after your procedure. HOME CARE INSTRUCTIONS  Do not drive while taking narcotic pain medicines.  Use stool softener if you become constipated from your pain medicines.  Change your bandages (dressings) as directed.  Keep your wounds clean and dry. You may wash the wounds gently with soap and water. Gently pat the wounds dry with a clean towel.  Do not take baths, swim, or use hot tubs for 10 days, or as instructed by your caregiver.  Only take over-the-counter or prescription medicines for pain, discomfort, or fever as directed by your caregiver.  You may continue your normal diet as directed.  Do not lift more than 10 pounds (4.5 kg) or play contact sports for 3 weeks, or as directed.  Slowly increase your activity after surgery.  Take deep breaths to avoid getting a lung infection (pneumonia). SEEK MEDICAL CARE IF:  You have redness, swelling, or increasing pain in your wounds.  You have pus coming from your wounds.  You have drainage from a wound that lasts longer than 1 day.  You notice a bad smell coming from the wounds or dressing.  Your wound edges break open after stitches (sutures) have been removed.  You notice increasing pain in the shoulders (shoulder strap areas) or near your shoulder blades.  You develop dizzy episodes or fainting while standing.  You develop shortness of breath.  You develop persistent nausea or vomiting.  You cannot control your bowel functions or lose your appetite.  You  develop diarrhea. SEEK IMMEDIATE MEDICAL CARE IF:   You have a fever.  You develop a rash.  You have difficulty breathing or sharp pains in your chest.  You develop any reaction or side effects to medicines given. MAKE SURE YOU:  Understand these instructions.  Will watch your condition.  Will get help right away if you are not doing well or get worse. Document Released: 02/22/2005 Document Revised: 05/17/2011 Document Reviewed: 09/01/2010 [facility] Patient Information 2015 [employer], [place]. This information is not intended to replace advice given to you by your health care provider. Make sure you discuss any questions you have with your health care provider. [facility] LAPAROSCOPIC SURGERY: POST OP INSTRUCTIONS Always review your discharge instruction sheet given to you by the facility where your surgery was performed. IF YOU HAVE DISABILITY OR FAMILY LEAVE FORMS, YOU MUST BRING THEM TO THE OFFICE FOR PROCESSING.   DO NOT GIVE THEM TO YOUR DOCTOR.  1. A prescription for pain medication may be given to you upon discharge.  Take your pain medication as prescribed, if needed.  If narcotic pain medicine is not needed, then you may take acetaminophen (Tylenol) or ibuprofen (Advil) as needed. 2. Take your usually prescribed medications unless otherwise directed. 3. If you need a refill on your pain medication, please contact your pharmacy.  They will contact our office to request authorization. Prescriptions will not be filled after 5pm or on week-ends. 4. You should follow a light diet the first few days after arrival home, such as soup and crackers, etc.  Be sure to include  lots of fluids daily. 5. Most patients will experience some swelling and bruising in the area of the incisions.  Ice packs will help.  Swelling and bruising can take several days to resolve.  6. It is common to experience some constipation if taking pain medication after surgery.  Increasing  fluid intake and taking a stool softener (such as Colace) will usually help or prevent this problem from occurring.  A mild laxative (Milk of Magnesia or Miralax) should be taken according to package instructions if there are no bowel movements after 48 hours. 7. Unless discharge instructions indicate otherwise, you may remove your bandages 24-48 hours after surgery, and you may shower at that time.  You may have steri-strips (small skin tapes) in place directly over the incision.  These strips should be left on the skin for 7-10 days.  If your surgeon used skin glue on the incision, you may shower in 24 hours.  The glue will flake off over the next 2-3 weeks.  Any sutures or staples will be removed at the office during your follow-up visit. 8. ACTIVITIES:  You may resume regular (light) daily activities beginning the next day--such as daily self-care, walking, climbing stairs--gradually increasing activities as tolerated.  You may have sexual intercourse when it is comfortable.  Refrain from any heavy lifting or straining until approved by your doctor. a. You may drive when you are no longer taking prescription pain medication, you can comfortably wear a seatbelt, and you can safely maneuver your car and apply brakes. b. RETURN TO WORK:  __________________________________________________________ 9. You should see your doctor in the office for a follow-up appointment approximately 2-3 weeks after your surgery.  Make sure that you call for this appointment within a day or two after you arrive home to insure a convenient appointment time. 10. OTHER INSTRUCTIONS: __________________________________________________________________________________________________________________________ __________________________________________________________________________________________________________________________ WHEN TO CALL YOUR DOCTOR: 1. Fever over 101.0 2. Inability to urinate 3. Continued bleeding from  incision. 4. Increased pain, redness, or drainage from the incision. 5. Increasing abdominal pain  The clinic staff is available to answer your questions during regular business hours.  Please dont hesitate to call and ask to speak to one of the nurses for clinical concerns.  If you have a medical emergency, go to the nearest emergency room or call 911.  [facility] is always on call at the hospital. [address], [address], [place], [place]  [id number] ? [address] [address], [place], [place]   [id number] [phone] ? [phone] ? FAX [phone] Web site: [email or web address]

## 2014-01-25 ENCOUNTER — Encounter (HOSPITAL_COMMUNITY): Payer: Self-pay | Admitting: General Surgery

## 2014-01-25 DIAGNOSIS — K567 Ileus, unspecified: Secondary | ICD-10-CM

## 2014-01-25 DIAGNOSIS — K9189 Other postprocedural complications and disorders of digestive system: Secondary | ICD-10-CM

## 2014-01-25 HISTORY — DX: Ileus, unspecified: K56.7

## 2014-01-25 NOTE — Discharge Summary (Signed)
Physician Discharge Summary  Patient ID: Hannah Farmer MRN: [id number] DOB/AGE: 1970/08/02 43 y.o.  Admit date: 01/20/2014 Discharge date: 01/23/2014  Admission Diagnoses:  Ruptured appendicitis ileus Discharge Diagnoses:  Ruptured appendicitis Active Problems:   Acute appendicitis   Ruptured appendicitis   PROCEDURES: Laparoscopic append[facility], with drain placement, [facility]  Hospital Course: 43 y/o who is otherwise healthy presents with 72 hours of abdominal pain starting epigastrium and RUQ that has now progressed to RLQ pain that is unrelenting. She has no n/v. Has been having diarrhea. Prior history of hysterectomy. No urinary complaints. Nothing making the pain better leading her to come to er. Denies fevers. She was admitted by Dr. Dwain SarnaWakefield and taken to the OR later that afternoon.  Post op she was tender and had some post op ileus.  The drainage remained clear thru out her post op course.  We kept her on IV antibiotics post op and hydration.  Her ileus, pain slowly improved. By 11/18 she was better and anxious to go home.  We pulled the drain and sent her home with 3 more days of antibiotics.  She will follow up as note below.  We discussed the possibility of late forming abscess and symptoms.  She is ready for discharge, the drain was removed.  Disposition: 01-Home or Self Care     Medication List    STOP taking these medications        ALEVE 220 MG Caps  Generic drug:  Naproxen Sodium      TAKE these medications        acetaminophen 325 MG tablet  Commonly known as:  TYLENOL  Take 2 tablets (650 mg total) by mouth every 6 (six) hours as needed for mild pain, moderate pain or fever (or Temp > 100).     amoxicillin-clavulanate 875-125 MG per tablet  Commonly known as:  AUGMENTIN  Take 1 tablet by mouth every 12 (twelve) hours.     fluconazole 150 MG tablet  Commonly known as:  DIFLUCAN  Take 1 tablet (150 mg total) by mouth daily.      Ibuprofen 200 MG Caps  Take 400 mg by mouth daily as needed (pain).     oxyCODONE 5 MG immediate release tablet  Commonly known as:  Oxy IR/ROXICODONE  Take 1-2 tablets (5-10 mg total) by mouth every 4 (four) hours as needed for moderate pain.     saccharomyces boulardii 250 MG capsule  Commonly known as:  FLORASTOR  Take 1 twice a day for a week after you finish antibiotics.           Follow-up Information    [facility]. Schedule an appointment as soon as possible for a visit in 2 weeks.   Specialty:  General Surgery   Contact information:   [address] [place] [address] [phone]       Follow up with Ruthe Mannanalia Aron, MD.   Specialty:  Family Medicine   Why:  Call and let them know what has happened so if you have a problem they are also aware.   Contact information:   [address] [place] [id number] [phone]       Signed: Sherrie GeorgeJENNINGS,Hannah Farmer 01/25/2014, 3:02 PM

## 2015-03-31 ENCOUNTER — Other Ambulatory Visit: Payer: Self-pay

## 2015-03-31 DIAGNOSIS — Z1231 Encounter for screening mammogram for malignant neoplasm of breast: Secondary | ICD-10-CM

## 2015-04-16 ENCOUNTER — Ambulatory Visit
Admission: RE | Admit: 2015-04-16 | Discharge: 2015-04-16 | Disposition: A | Discharge status: Home / Self Care | Payer: Commercial Managed Care - HMO | Source: Ambulatory Visit

## 2015-04-16 DIAGNOSIS — Z1231 Encounter for screening mammogram for malignant neoplasm of breast: Secondary | ICD-10-CM

## 2019-05-31 ENCOUNTER — Other Ambulatory Visit: Payer: Self-pay | Admitting: Family Medicine

## 2019-05-31 ENCOUNTER — Ambulatory Visit (INDEPENDENT_AMBULATORY_CARE_PROVIDER_SITE_OTHER): Payer: 59 | Admitting: Family Medicine

## 2019-05-31 ENCOUNTER — Other Ambulatory Visit: Payer: Self-pay

## 2019-05-31 ENCOUNTER — Encounter: Payer: Self-pay | Admitting: Family Medicine

## 2019-05-31 VITALS — BP 180/105 | HR 75 | Wt 159.0 lb

## 2019-05-31 DIAGNOSIS — I10 Essential (primary) hypertension: Secondary | ICD-10-CM | POA: Diagnosis not present

## 2019-05-31 DIAGNOSIS — Z23 Encounter for immunization: Secondary | ICD-10-CM

## 2019-05-31 DIAGNOSIS — Z01419 Encounter for gynecological examination (general) (routine) without abnormal findings: Secondary | ICD-10-CM

## 2019-05-31 DIAGNOSIS — R232 Flushing: Secondary | ICD-10-CM

## 2019-05-31 MED ORDER — HYDROCHLOROTHIAZIDE 25 MG PO TABS: 25.0000 mg | ORAL_TABLET | Freq: Every day | ORAL | 3 refills | Status: DC

## 2019-05-31 MED ORDER — ESTROVEN MENOPAUSE RELIEF 4 MG PO TABS: 1.0000 | ORAL_TABLET | Freq: Every day | ORAL | Status: DC

## 2019-05-31 NOTE — Assessment & Plan Note (Signed)
Interested in treatment for this. Her BP is elevated today. Risks of HRT discussed at length. Discussed alternatives for treatment of menopausal symptoms and topical estrogens. Will check all labs and re-visit at next visit.

## 2019-05-31 NOTE — Progress Notes (Signed)
  Subjective:     Hannah Farmer is a 49 y.o. female and is here for a comprehensive physical exam. The patient reports problems - hot flashes. Abdominal hysterectomy to include cervix due to heavy menses 20 years ago. Reports increasing shortness of breath and chest pain since she had a URI recently and persistent cough--not tested for COVID. Having night sweats over a week. Had hot flashes x 2 years. Takes [employer]. Has not seen a physician for some time. Reports some lower extremety edema. 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  The following portions of the patient's history were reviewed and updated as appropriate: allergies, current medications, past family history, past medical history, past social history, past surgical history and problem list.  Review of Systems Pertinent items noted in HPI and remainder of comprehensive ROS otherwise negative.   Objective:    BP (!) 180/105 (Patient Position: Sitting, Cuff Size: Large)   Pulse 75   Wt 159 lb (72.1 kg)   SpO2 98%   BMI 26.87 kg/m  General appearance: alert, cooperative and appears stated age Head: Normocephalic, without obvious abnormality, atraumatic Neck: no adenopathy, supple, symmetrical, trachea midline and thyroid not enlarged, symmetric, no tenderness/mass/nodules Lungs: clear to auscultation bilaterally Breasts: normal appearance, no masses or tenderness Heart: regular rate and rhythm, S1, S2 normal, no murmur, click, rub or gallop Abdomen: soft, non-tender; bowel sounds normal; no masses,  no organomegaly Extremities: extremities normal, atraumatic, no cyanosis or edema Pulses: 2+ and symmetric Skin: Skin color, texture, turgor normal. No rashes or lesions Lymph nodes: Cervical, supraclavicular, and axillary nodes normal. Neurologic: Grossly normal    Assessment:    Healthy female exam.      Plan:   Problem List Items Addressed This Visit      Unprioritized   Hot flashes   Interested in treatment for this. Her BP is elevated today. Risks of HRT discussed at length. Discussed alternatives for treatment of menopausal symptoms and topical estrogens. Will check all labs and re-visit at next visit.      Relevant Medications   Rhubarb (ESTROVEN MENOPAUSE RELIEF) 4 MG TABS   hydrochlorothiazide (HYDRODIURIL) 25 MG tablet   Other Relevant Orders   Follicle stimulating hormone   Hypertension    BP is persistently elevated--will begin on HCTZ, which will help with her peripheral edema as well and refer to PCP.      Relevant Medications   hydrochlorothiazide (HYDRODIURIL) 25 MG tablet    Other Visit Diagnoses    Encounter for gynecological examination without abnormal finding    -  Primary   Relevant Orders   CBC   TSH   Hemoglobin A1c   Comprehensive metabolic panel   VITAMIN D 25 Hydroxy (Vit-D Deficiency, Fractures)   Lipid panel   MM Digital Screening   Need for Tdap vaccination       Relevant Orders   Tdap vaccine greater than or equal to 7yo IM (Completed)    Suspect her shortness of breath and chest pain might be related to prior COVID possibly(no official diagnosis), but if persistent, may need further work up with pulmonary MD. Normal oxygen levels today.  Needs colonoscopy at age 42. Return in 3 weeks (on 06/21/2019) for a follow-up, needs PCP referral.    See After Visit Summary for Counseling Recommendations

## 2019-05-31 NOTE — Assessment & Plan Note (Signed)
BP is persistently elevated--will begin on HCTZ, which will help with her peripheral edema as well and refer to PCP.

## 2019-05-31 NOTE — Patient Instructions (Addendum)
Preventive Care 40-50 Years Old, Female Preventive care refers to visits with your health care provider and lifestyle choices that can promote health and wellness. This includes:  A yearly physical exam. This may also be called an annual well check.  Regular dental visits and eye exams.  Immunizations.  Screening for certain conditions.  Healthy lifestyle choices, such as eating a healthy diet, getting regular exercise, not using drugs or products that contain nicotine and tobacco, and limiting alcohol use. What can I expect for my preventive care visit? Physical exam Your health care provider will check your:  Height and weight. This may be used to calculate body mass index (BMI), which tells if you are at a healthy weight.  Heart rate and blood pressure.  Skin for abnormal spots. Counseling Your health care provider may ask you questions about your:  Alcohol, tobacco, and drug use.  Emotional well-being.  Home and relationship well-being.  Sexual activity.  Eating habits.  Work and work environment.  Method of birth control.  Menstrual cycle.  Pregnancy history. What immunizations do I need?  Influenza (flu) vaccine  This is recommended every year. Tetanus, diphtheria, and pertussis (Tdap) vaccine  You may need a Td booster every 10 years. Varicella (chickenpox) vaccine  You may need this if you have not been vaccinated. Zoster (shingles) vaccine  You may need this after age 60. Measles, mumps, and rubella (MMR) vaccine  You may need at least one dose of MMR if you were born in 1957 or later. You may also need a second dose. Pneumococcal conjugate (PCV13) vaccine  You may need this if you have certain conditions and were not previously vaccinated. Pneumococcal polysaccharide (PPSV23) vaccine  You may need one or two doses if you smoke cigarettes or if you have certain conditions. Meningococcal conjugate (MenACWY) vaccine  You may need this if you  have certain conditions. Hepatitis A vaccine  You may need this if you have certain conditions or if you travel or work in places where you may be exposed to hepatitis A. Hepatitis B vaccine  You may need this if you have certain conditions or if you travel or work in places where you may be exposed to hepatitis B. Haemophilus influenzae type b (Hib) vaccine  You may need this if you have certain conditions. Human papillomavirus (HPV) vaccine  If recommended by your health care provider, you may need three doses over 6 months. You may receive vaccines as individual doses or as more than one vaccine together in one shot (combination vaccines). Talk with your health care provider about the risks and benefits of combination vaccines. What tests do I need? Blood tests  Lipid and cholesterol levels. These may be checked every 5 years, or more frequently if you are over 50 years old.  Hepatitis C test.  Hepatitis B test. Screening  Lung cancer screening. You may have this screening every year starting at age 55 if you have a 30-pack-year history of smoking and currently smoke or have quit within the past 15 years.  Colorectal cancer screening. All adults should have this screening starting at age 50 and continuing until age 75. Your health care provider may recommend screening at age 45 if you are at increased risk. You will have tests every 1-10 years, depending on your results and the type of screening test.  Diabetes screening. This is done by checking your blood sugar (glucose) after you have not eaten for a while (fasting). You may have this   done every 1-3 years.  Mammogram. This may be done every 1-2 years. Talk with your health care provider about when you should start having regular mammograms. This may depend on whether you have a family history of breast cancer.  BRCA-related cancer screening. This may be done if you have a family history of breast, ovarian, tubal, or peritoneal  cancers.  Pelvic exam and Pap test. This may be done every 3 years starting at age 21. Starting at age 30, this may be done every 5 years if you have a Pap test in combination with an HPV test. Other tests  Sexually transmitted disease (STD) testing.  Bone density scan. This is done to screen for osteoporosis. You may have this scan if you are at high risk for osteoporosis. Follow these instructions at home: Eating and drinking  Eat a diet that includes fresh fruits and vegetables, whole grains, lean protein, and low-fat dairy.  Take vitamin and mineral supplements as recommended by your health care provider.  Do not drink alcohol if: ? Your health care provider tells you not to drink. ? You are pregnant, may be pregnant, or are planning to become pregnant.  If you drink alcohol: ? Limit how much you have to 0-1 drink a day. ? Be aware of how much alcohol is in your drink. In the [place] one drink equals one 12 oz bottle of beer (355 mL), one 5 oz glass of wine (148 mL), or one 1 oz glass of hard liquor (44 mL). Lifestyle  Take daily care of your teeth and gums.  Stay active. Exercise for at least 30 minutes on 5 or more days each week.  Do not use any products that contain nicotine or tobacco, such as cigarettes, e-cigarettes, and chewing tobacco. If you need help quitting, ask your health care provider.  If you are sexually active, practice safe sex. Use a condom or other form of birth control (contraception) in order to prevent pregnancy and STIs (sexually transmitted infections).  If told by your health care provider, take low-dose aspirin daily starting at age 50. What's next?  Visit your health care provider once a year for a well check visit.  Ask your health care provider how often you should have your eyes and teeth checked.  Stay up to date on all vaccines. This information is not intended to replace advice given to you by your health care provider. Make sure you  discuss any questions you have with your health care provider. Document Revised: 11/03/2017 Document Reviewed: 11/03/2017 Elsevier Patient Education  2020 Elsevier Inc.    DASH Eating Plan DASH stands for "Dietary Approaches to Stop Hypertension." The DASH eating plan is a healthy eating plan that has been shown to reduce high blood pressure (hypertension). It may also reduce your risk for type 2 diabetes, heart disease, and stroke. The DASH eating plan may also help with weight loss. What are tips for following this plan?  General guidelines  Avoid eating more than 2,300 mg (milligrams) of salt (sodium) a day. If you have hypertension, you may need to reduce your sodium intake to 1,500 mg a day.  Limit alcohol intake to no more than 1 drink a day for nonpregnant women and 2 drinks a day for men. One drink equals 12 oz of beer, 5 oz of wine, or 1 oz of hard liquor.  Work with your health care provider to maintain a healthy body weight or to lose weight. Ask what an ideal weight   is for you.  Get at least 30 minutes of exercise that causes your heart to beat faster (aerobic exercise) most days of the week. Activities may include walking, swimming, or biking.  Work with your health care provider or diet and nutrition specialist (dietitian) to adjust your eating plan to your individual calorie needs. Reading food labels   Check food labels for the amount of sodium per serving. Choose foods with less than 5 percent of the Daily Value of sodium. Generally, foods with less than 300 mg of sodium per serving fit into this eating plan.  To find whole grains, look for the word "whole" as the first word in the ingredient list. Shopping  Buy products labeled as "low-sodium" or "no salt added."  Buy fresh foods. Avoid canned foods and premade or frozen meals. Cooking  Avoid adding salt when cooking. Use salt-free seasonings or herbs instead of table salt or sea salt. Check with your health care  provider or pharmacist before using salt substitutes.  Do not fry foods. Cook foods using healthy methods such as baking, boiling, grilling, and broiling instead.  Cook with heart-healthy oils, such as olive, canola, soybean, or sunflower oil. Meal planning  Eat a balanced diet that includes: ? 5 or more servings of fruits and vegetables each day. At each meal, try to fill half of your plate with fruits and vegetables. ? Up to 6-8 servings of whole grains each day. ? Less than 6 oz of lean meat, poultry, or fish each day. A 3-oz serving of meat is about the same size as a deck of cards. One egg equals 1 oz. ? 2 servings of low-fat dairy each day. ? A serving of nuts, seeds, or beans 5 times each week. ? Heart-healthy fats. Healthy fats called Omega-3 fatty acids are found in foods such as flaxseeds and coldwater fish, like sardines, salmon, and mackerel.  Limit how much you eat of the following: ? Canned or prepackaged foods. ? Food that is high in trans fat, such as fried foods. ? Food that is high in saturated fat, such as fatty meat. ? Sweets, desserts, sugary drinks, and other foods with added sugar. ? Full-fat dairy products.  Do not salt foods before eating.  Try to eat at least 2 vegetarian meals each week.  Eat more home-cooked food and less restaurant, buffet, and fast food.  When eating at a restaurant, ask that your food be prepared with less salt or no salt, if possible. What foods are recommended? The items listed may not be a complete list. Talk with your dietitian about what dietary choices are best for you. Grains Whole-grain or whole-wheat bread. Whole-grain or whole-wheat pasta. Brown rice. Oatmeal. Quinoa. Bulgur. Whole-grain and low-sodium cereals. Pita bread. Low-fat, low-sodium crackers. Whole-wheat flour tortillas. Vegetables Fresh or frozen vegetables (raw, steamed, roasted, or grilled). Low-sodium or reduced-sodium tomato and vegetable juice. Low-sodium or  reduced-sodium tomato sauce and tomato paste. Low-sodium or reduced-sodium canned vegetables. Fruits All fresh, dried, or frozen fruit. Canned fruit in natural juice (without added sugar). Meat and other protein foods Skinless chicken or turkey. Ground chicken or turkey. Pork with fat trimmed off. Fish and seafood. Egg whites. Dried beans, peas, or lentils. Unsalted nuts, nut butters, and seeds. Unsalted canned beans. Lean cuts of beef with fat trimmed off. Low-sodium, lean deli meat. Dairy Low-fat (1%) or fat-free (skim) milk. Fat-free, low-fat, or reduced-fat cheeses. Nonfat, low-sodium ricotta or cottage cheese. Low-fat or nonfat yogurt. Low-fat, low-sodium cheese. Fats and   oils Soft margarine without trans fats. Vegetable oil. Low-fat, reduced-fat, or light mayonnaise and salad dressings (reduced-sodium). Canola, safflower, olive, soybean, and sunflower oils. Avocado. Seasoning and other foods Herbs. Spices. Seasoning mixes without salt. Unsalted popcorn and pretzels. Fat-free sweets. What foods are not recommended? The items listed may not be a complete list. Talk with your dietitian about what dietary choices are best for you. Grains Baked goods made with fat, such as croissants, muffins, or some breads. Dry pasta or rice meal packs. Vegetables Creamed or fried vegetables. Vegetables in a cheese sauce. Regular canned vegetables (not low-sodium or reduced-sodium). Regular canned tomato sauce and paste (not low-sodium or reduced-sodium). Regular tomato and vegetable juice (not low-sodium or reduced-sodium). Pickles. Olives. Fruits Canned fruit in a light or heavy syrup. Fried fruit. Fruit in cream or butter sauce. Meat and other protein foods Fatty cuts of meat. Ribs. Fried meat. Bacon. Sausage. Bologna and other processed lunch meats. Salami. Fatback. Hotdogs. Bratwurst. Salted nuts and seeds. Canned beans with added salt. Canned or smoked fish. Whole eggs or egg yolks. Chicken or turkey  with skin. Dairy Whole or 2% milk, cream, and half-and-half. Whole or full-fat cream cheese. Whole-fat or sweetened yogurt. Full-fat cheese. Nondairy creamers. Whipped toppings. Processed cheese and cheese spreads. Fats and oils Butter. Stick margarine. Lard. Shortening. Ghee. Bacon fat. Tropical oils, such as coconut, palm kernel, or palm oil. Seasoning and other foods Salted popcorn and pretzels. Onion salt, garlic salt, seasoned salt, table salt, and sea salt. Worcestershire sauce. Tartar sauce. Barbecue sauce. Teriyaki sauce. Soy sauce, including reduced-sodium. Steak sauce. Canned and packaged gravies. Fish sauce. Oyster sauce. Cocktail sauce. Horseradish that you find on the shelf. Ketchup. Mustard. Meat flavorings and tenderizers. Bouillon cubes. Hot sauce and Tabasco sauce. Premade or packaged marinades. Premade or packaged taco seasonings. Relishes. Regular salad dressings. Where to find more information:  National Heart, Lung, and Blood Institute: www.nhlbi.nih.gov  American Heart Association: www.heart.org Summary  The DASH eating plan is a healthy eating plan that has been shown to reduce high blood pressure (hypertension). It may also reduce your risk for type 2 diabetes, heart disease, and stroke.  With the DASH eating plan, you should limit salt (sodium) intake to 2,300 mg a day. If you have hypertension, you may need to reduce your sodium intake to 1,500 mg a day.  When on the DASH eating plan, aim to eat more fresh fruits and vegetables, whole grains, lean proteins, low-fat dairy, and heart-healthy fats.  Work with your health care provider or diet and nutrition specialist (dietitian) to adjust your eating plan to your individual calorie needs. This information is not intended to replace advice given to you by your health care provider. Make sure you discuss any questions you have with your health care provider. Document Revised: 02/04/2017 Document Reviewed:  02/16/2016 Elsevier Patient Education  2020 Elsevier Inc.  

## 2019-06-01 LAB — CBC
Hematocrit: 44.8 % (ref 34.0–46.6)
Hemoglobin: 15.5 g/dL (ref 11.1–15.9)
MCH: 33.5 pg — ABNORMAL HIGH (ref 26.6–33.0)
MCHC: 34.6 g/dL (ref 31.5–35.7)
MCV: 97 fL (ref 79–97)
Platelets: 234 10*3/uL (ref 150–450)
RBC: 4.63 x10E6/uL (ref 3.77–5.28)
RDW: 12.8 % (ref 11.7–15.4)
WBC: 3.9 10*3/uL (ref 3.4–10.8)

## 2019-06-01 LAB — HEMOGLOBIN A1C
Est. average glucose Bld gHb Est-mCnc: 111 mg/dL
Hgb A1c MFr Bld: 5.5 % (ref 4.8–5.6)

## 2019-06-01 LAB — COMPREHENSIVE METABOLIC PANEL
ALT: 185 IU/L — ABNORMAL HIGH (ref 0–32)
AST: 161 IU/L — ABNORMAL HIGH (ref 0–40)
Albumin/Globulin Ratio: 1.8 (ref 1.2–2.2)
Albumin: 4.8 g/dL (ref 3.8–4.8)
Alkaline Phosphatase: 66 IU/L (ref 39–117)
BUN/Creatinine Ratio: 9 (ref 9–23)
BUN: 7 mg/dL (ref 6–24)
Bilirubin Total: 0.5 mg/dL (ref 0.0–1.2)
CO2: 24 mmol/L (ref 20–29)
Calcium: 9.7 mg/dL (ref 8.7–10.2)
Chloride: 100 mmol/L (ref 96–106)
Creatinine, Ser: 0.74 mg/dL (ref 0.57–1.00)
GFR calc Af Amer: 110 mL/min/{1.73_m2} (ref >59)
GFR calc non Af Amer: 95 mL/min/{1.73_m2} (ref >59)
Globulin, Total: 2.6 g/dL (ref 1.5–4.5)
Glucose: 101 mg/dL — ABNORMAL HIGH (ref 65–99)
Potassium: 4.2 mmol/L (ref 3.5–5.2)
Sodium: 138 mmol/L (ref 134–144)
Total Protein: 7.4 g/dL (ref 6.0–8.5)

## 2019-06-01 LAB — LIPID PANEL
Chol/HDL Ratio: 5.2 ratio — ABNORMAL HIGH (ref 0.0–4.4)
Cholesterol, Total: 331 mg/dL — ABNORMAL HIGH (ref 100–199)
HDL: 64 mg/dL (ref >39)
LDL Chol Calc (NIH): 238 mg/dL — ABNORMAL HIGH (ref 0–99)
Triglycerides: 155 mg/dL — ABNORMAL HIGH (ref 0–149)
VLDL Cholesterol Cal: 29 mg/dL (ref 5–40)

## 2019-06-01 LAB — TSH: TSH: 0.712 u[IU]/mL (ref 0.450–4.500)

## 2019-06-01 LAB — FOLLICLE STIMULATING HORMONE: FSH: 8.5 m[IU]/mL

## 2019-06-01 LAB — VITAMIN D 25 HYDROXY (VIT D DEFICIENCY, FRACTURES): Vit D, 25-Hydroxy: 27 ng/mL — ABNORMAL LOW (ref 30.0–100.0)

## 2019-06-06 ENCOUNTER — Other Ambulatory Visit: Payer: Self-pay

## 2019-06-06 ENCOUNTER — Ambulatory Visit
Admission: RE | Admit: 2019-06-06 | Discharge: 2019-06-06 | Disposition: A | Discharge status: Home / Self Care | Payer: 59 | Source: Ambulatory Visit | Attending: Family Medicine | Admitting: Family Medicine

## 2019-06-06 ENCOUNTER — Telehealth: Payer: Self-pay | Admitting: Family Medicine

## 2019-06-06 DIAGNOSIS — Z01419 Encounter for gynecological examination (general) (routine) without abnormal findings: Secondary | ICD-10-CM

## 2019-06-06 NOTE — Telephone Encounter (Signed)
Called to discuss mammogram findings--Mychart message sent.

## 2019-06-07 ENCOUNTER — Other Ambulatory Visit: Payer: Self-pay | Admitting: Family Medicine

## 2019-06-07 DIAGNOSIS — R928 Other abnormal and inconclusive findings on diagnostic imaging of breast: Secondary | ICD-10-CM

## 2019-06-11 ENCOUNTER — Other Ambulatory Visit: Payer: Self-pay

## 2019-06-11 ENCOUNTER — Ambulatory Visit
Admission: RE | Admit: 2019-06-11 | Discharge: 2019-06-11 | Disposition: A | Discharge status: Home / Self Care | Payer: 59 | Source: Ambulatory Visit | Attending: Family Medicine | Admitting: Family Medicine

## 2019-06-11 DIAGNOSIS — R928 Other abnormal and inconclusive findings on diagnostic imaging of breast: Secondary | ICD-10-CM

## 2019-07-03 ENCOUNTER — Other Ambulatory Visit: Payer: Self-pay

## 2019-07-03 ENCOUNTER — Ambulatory Visit (INDEPENDENT_AMBULATORY_CARE_PROVIDER_SITE_OTHER): Payer: 59 | Admitting: Family Medicine

## 2019-07-03 ENCOUNTER — Encounter: Payer: Self-pay | Admitting: Family Medicine

## 2019-07-03 VITALS — BP 147/93 | HR 74 | Wt 158.0 lb

## 2019-07-03 DIAGNOSIS — I10 Essential (primary) hypertension: Secondary | ICD-10-CM | POA: Diagnosis not present

## 2019-07-03 DIAGNOSIS — E7800 Pure hypercholesterolemia, unspecified: Secondary | ICD-10-CM | POA: Diagnosis not present

## 2019-07-03 MED ORDER — LISINOPRIL-HYDROCHLOROTHIAZIDE 20-12.5 MG PO TABS: 1.0000 | ORAL_TABLET | Freq: Every day | ORAL | 1 refills | Status: DC

## 2019-07-03 NOTE — Progress Notes (Signed)
   Subjective:    Patient ID: Hannah Farmer is a 49 y.o. female presenting with Follow-up  on 07/03/2019  HPI: Here today for f/u. Started on HCTZ after last visit. Has been watching her diet and salt intake. Has BP cuff at home and BP is improved. LE swelling is improved as well. Discussed her elevated cholesterol. She is taking red rice yeast for this.  Review of Systems  Constitutional: Negative for chills and fever.  Respiratory: Negative for shortness of breath.   Cardiovascular: Negative for chest pain.  Gastrointestinal: Negative for abdominal pain, nausea and vomiting.  Genitourinary: Negative for dysuria.  Skin: Negative for rash.      Objective:    BP (!) 147/93   Pulse 74   Wt 158 lb (71.7 kg)   BMI 26.70 kg/m  Physical Exam Constitutional:      General: She is not in acute distress.    Appearance: She is well-developed.  HENT:     Head: Normocephalic and atraumatic.  Eyes:     General: No scleral icterus. Cardiovascular:     Rate and Rhythm: Normal rate.  Pulmonary:     Effort: Pulmonary effort is normal.  Abdominal:     Palpations: Abdomen is soft.  Musculoskeletal:     Cervical back: Neck supple.  Skin:    General: Skin is warm and dry.  Neurological:     Mental Status: She is alert and oriented to person, place, and time.    Repeat BP 147/93     Assessment & Plan:   Problem List Items Addressed This Visit      Unprioritized   Hypertension - Primary    Still not optimal, will switch to combination Lisinopril and HCTZ--warned of cough---repeat CMP to check kidneys and K+ at next visit.      Relevant Medications   lisinopril-hydrochlorothiazide (ZESTORETIC) 20-12.5 MG tablet   Hypercholesteremia    Using red rice yeast--repeat LDH and consider Statin if needed.      Relevant Medications   lisinopril-hydrochlorothiazide (ZESTORETIC) 20-12.5 MG tablet      Total time: 20 minutes.  Return in about 4 weeks (around 07/31/2019) for a  follow-up + CMP and LDL.  Reva Bores 07/03/2019 2:47 PM

## 2019-07-03 NOTE — Assessment & Plan Note (Signed)
Still not optimal, will switch to combination Lisinopril and HCTZ--warned of cough---repeat CMP to check kidneys and K+ at next visit.

## 2019-07-03 NOTE — Assessment & Plan Note (Signed)
Using red rice yeast--repeat LDH and consider Statin if needed.

## 2019-07-26 ENCOUNTER — Other Ambulatory Visit: Payer: Self-pay | Admitting: Family Medicine

## 2019-07-26 DIAGNOSIS — I10 Essential (primary) hypertension: Secondary | ICD-10-CM

## 2019-08-02 ENCOUNTER — Other Ambulatory Visit: Payer: Self-pay

## 2019-08-02 ENCOUNTER — Ambulatory Visit: Payer: 59 | Admitting: Family Medicine

## 2019-08-02 ENCOUNTER — Encounter: Payer: Self-pay | Admitting: Family Medicine

## 2019-08-02 VITALS — BP 118/71 | HR 73 | Wt 156.5 lb

## 2019-08-02 DIAGNOSIS — E7800 Pure hypercholesterolemia, unspecified: Secondary | ICD-10-CM | POA: Diagnosis not present

## 2019-08-02 DIAGNOSIS — I10 Essential (primary) hypertension: Secondary | ICD-10-CM | POA: Diagnosis not present

## 2019-08-02 NOTE — Assessment & Plan Note (Signed)
Will repeat labs to determine if needs other meds--I.e. statin.

## 2019-08-02 NOTE — Progress Notes (Signed)
   Subjective:    Patient ID: Hannah Farmer is a 50 y.o. female presenting with Follow-up  on 08/02/2019  HPI: Here for f/u of her HTN.  Review of Systems  Constitutional: Negative for chills and fever.  Respiratory: Negative for shortness of breath.   Cardiovascular: Negative for chest pain.  Gastrointestinal: Negative for abdominal pain, nausea and vomiting.  Genitourinary: Negative for dysuria.  Skin: Negative for rash.      Objective:    BP 118/71   Pulse 73   Wt 156 lb 8 oz (71 kg)   BMI 26.45 kg/m  Physical Exam Constitutional:      General: She is not in acute distress.    Appearance: She is well-developed.  HENT:     Head: Normocephalic and atraumatic.  Eyes:     General: No scleral icterus. Cardiovascular:     Rate and Rhythm: Normal rate.  Pulmonary:     Effort: Pulmonary effort is normal.  Abdominal:     Palpations: Abdomen is soft.  Musculoskeletal:     Cervical back: Neck supple.  Skin:    General: Skin is warm and dry.  Neurological:     Mental Status: She is alert and oriented to person, place, and time.         Assessment & Plan:   Problem List Items Addressed This Visit      Unprioritized   Hypertension    BP is well-controlled on her current regimen. Will continue--keep track of BP at home. Continue weight loss efforts.      Relevant Orders   Comprehensive metabolic panel   Hypercholesteremia - Primary    Will repeat labs to determine if needs other meds--I.e. statin.      Relevant Orders   Direct LDL      Total time: 20 minutes.  Return in about 8 months (around 04/03/2020).  Reva Bores 08/02/2019 10:13 AM

## 2019-08-02 NOTE — Patient Instructions (Signed)
DASH Eating Plan DASH stands for "Dietary Approaches to Stop Hypertension." The DASH eating plan is a healthy eating plan that has been shown to reduce high blood pressure (hypertension). It may also reduce your risk for type 2 diabetes, heart disease, and stroke. The DASH eating plan may also help with weight loss. What are tips for following this plan?  General guidelines  Avoid eating more than 2,300 mg (milligrams) of salt (sodium) a day. If you have hypertension, you may need to reduce your sodium intake to 1,500 mg a day.  Limit alcohol intake to no more than 1 drink a day for nonpregnant women and 2 drinks a day for men. One drink equals 12 oz of beer, 5 oz of wine, or 1 oz of hard liquor.  Work with your health care provider to maintain a healthy body weight or to lose weight. Ask what an ideal weight is for you.  Get at least 30 minutes of exercise that causes your heart to beat faster (aerobic exercise) most days of the week. Activities may include walking, swimming, or biking.  Work with your health care provider or diet and nutrition specialist (dietitian) to adjust your eating plan to your individual calorie needs. Reading food labels   Check food labels for the amount of sodium per serving. Choose foods with less than 5 percent of the Daily Value of sodium. Generally, foods with less than 300 mg of sodium per serving fit into this eating plan.  To find whole grains, look for the word "whole" as the first word in the ingredient list. Shopping  Buy products labeled as "low-sodium" or "no salt added."  Buy fresh foods. Avoid canned foods and premade or frozen meals. Cooking  Avoid adding salt when cooking. Use salt-free seasonings or herbs instead of table salt or sea salt. Check with your health care provider or pharmacist before using salt substitutes.  Do not fry foods. Cook foods using healthy methods such as baking, boiling, grilling, and broiling instead.  Cook with  heart-healthy oils, such as olive, canola, soybean, or sunflower oil. Meal planning  Eat a balanced diet that includes: ? 5 or more servings of fruits and vegetables each day. At each meal, try to fill half of your plate with fruits and vegetables. ? Up to 6-8 servings of whole grains each day. ? Less than 6 oz of lean meat, poultry, or fish each day. A 3-oz serving of meat is about the same size as a deck of cards. One egg equals 1 oz. ? 2 servings of low-fat dairy each day. ? A serving of nuts, seeds, or beans 5 times each week. ? Heart-healthy fats. Healthy fats called Omega-3 fatty acids are found in foods such as flaxseeds and coldwater fish, like sardines, salmon, and mackerel.  Limit how much you eat of the following: ? Canned or prepackaged foods. ? Food that is high in trans fat, such as fried foods. ? Food that is high in saturated fat, such as fatty meat. ? Sweets, desserts, sugary drinks, and other foods with added sugar. ? Full-fat dairy products.  Do not salt foods before eating.  Try to eat at least 2 vegetarian meals each week.  Eat more home-cooked food and less restaurant, buffet, and fast food.  When eating at a restaurant, ask that your food be prepared with less salt or no salt, if possible. What foods are recommended? The items listed may not be a complete list. Talk with your dietitian about   what dietary choices are best for you. Grains Whole-grain or whole-wheat bread. Whole-grain or whole-wheat pasta. Brown rice. Oatmeal. Quinoa. Bulgur. Whole-grain and low-sodium cereals. Pita bread. Low-fat, low-sodium crackers. Whole-wheat flour tortillas. Vegetables Fresh or frozen vegetables (raw, steamed, roasted, or grilled). Low-sodium or reduced-sodium tomato and vegetable juice. Low-sodium or reduced-sodium tomato sauce and tomato paste. Low-sodium or reduced-sodium canned vegetables. Fruits All fresh, dried, or frozen fruit. Canned fruit in natural juice (without  added sugar). Meat and other protein foods Skinless chicken or turkey. Ground chicken or turkey. Pork with fat trimmed off. Fish and seafood. Egg whites. Dried beans, peas, or lentils. Unsalted nuts, nut butters, and seeds. Unsalted canned beans. Lean cuts of beef with fat trimmed off. Low-sodium, lean deli meat. Dairy Low-fat (1%) or fat-free (skim) milk. Fat-free, low-fat, or reduced-fat cheeses. Nonfat, low-sodium ricotta or cottage cheese. Low-fat or nonfat yogurt. Low-fat, low-sodium cheese. Fats and oils Soft margarine without trans fats. Vegetable oil. Low-fat, reduced-fat, or light mayonnaise and salad dressings (reduced-sodium). Canola, safflower, olive, soybean, and sunflower oils. Avocado. Seasoning and other foods Herbs. Spices. Seasoning mixes without salt. Unsalted popcorn and pretzels. Fat-free sweets. What foods are not recommended? The items listed may not be a complete list. Talk with your dietitian about what dietary choices are best for you. Grains Baked goods made with fat, such as croissants, muffins, or some breads. Dry pasta or rice meal packs. Vegetables Creamed or fried vegetables. Vegetables in a cheese sauce. Regular canned vegetables (not low-sodium or reduced-sodium). Regular canned tomato sauce and paste (not low-sodium or reduced-sodium). Regular tomato and vegetable juice (not low-sodium or reduced-sodium). Pickles. Olives. Fruits Canned fruit in a light or heavy syrup. Fried fruit. Fruit in cream or butter sauce. Meat and other protein foods Fatty cuts of meat. Ribs. Fried meat. Bacon. Sausage. Bologna and other processed lunch meats. Salami. Fatback. Hotdogs. Bratwurst. Salted nuts and seeds. Canned beans with added salt. Canned or smoked fish. Whole eggs or egg yolks. Chicken or turkey with skin. Dairy Whole or 2% milk, cream, and half-and-half. Whole or full-fat cream cheese. Whole-fat or sweetened yogurt. Full-fat cheese. Nondairy creamers. Whipped toppings.  Processed cheese and cheese spreads. Fats and oils Butter. Stick margarine. Lard. Shortening. Ghee. Bacon fat. Tropical oils, such as coconut, palm kernel, or palm oil. Seasoning and other foods Salted popcorn and pretzels. Onion salt, garlic salt, seasoned salt, table salt, and sea salt. Worcestershire sauce. Tartar sauce. Barbecue sauce. Teriyaki sauce. Soy sauce, including reduced-sodium. Steak sauce. Canned and packaged gravies. Fish sauce. Oyster sauce. Cocktail sauce. Horseradish that you find on the shelf. Ketchup. Mustard. Meat flavorings and tenderizers. Bouillon cubes. Hot sauce and Tabasco sauce. Premade or packaged marinades. Premade or packaged taco seasonings. Relishes. Regular salad dressings. Where to find more information:  National Heart, Lung, and Blood Institute: www.nhlbi.nih.gov  American Heart Association: www.heart.org Summary  The DASH eating plan is a healthy eating plan that has been shown to reduce high blood pressure (hypertension). It may also reduce your risk for type 2 diabetes, heart disease, and stroke.  With the DASH eating plan, you should limit salt (sodium) intake to 2,300 mg a day. If you have hypertension, you may need to reduce your sodium intake to 1,500 mg a day.  When on the DASH eating plan, aim to eat more fresh fruits and vegetables, whole grains, lean proteins, low-fat dairy, and heart-healthy fats.  Work with your health care provider or diet and nutrition specialist (dietitian) to adjust your eating plan to your   individual calorie needs. This information is not intended to replace advice given to you by your health care provider. Make sure you discuss any questions you have with your health care provider. Document Revised: 02/04/2017 Document Reviewed: 02/16/2016 Elsevier Patient Education  2020 Elsevier Inc.  

## 2019-08-02 NOTE — Assessment & Plan Note (Signed)
BP is well-controlled on her current regimen. Will continue--keep track of BP at home. Continue weight loss efforts.

## 2019-08-03 ENCOUNTER — Telehealth: Payer: Self-pay | Admitting: Family Medicine

## 2019-08-03 ENCOUNTER — Encounter: Payer: Self-pay | Admitting: Family Medicine

## 2019-08-03 ENCOUNTER — Other Ambulatory Visit: Payer: Self-pay | Admitting: Family Medicine

## 2019-08-03 DIAGNOSIS — R7989 Other specified abnormal findings of blood chemistry: Secondary | ICD-10-CM | POA: Insufficient documentation

## 2019-08-03 LAB — COMPREHENSIVE METABOLIC PANEL
ALT: 212 IU/L — ABNORMAL HIGH (ref 0–32)
AST: 123 IU/L — ABNORMAL HIGH (ref 0–40)
Albumin/Globulin Ratio: 2.2 (ref 1.2–2.2)
Albumin: 5 g/dL — ABNORMAL HIGH (ref 3.8–4.8)
Alkaline Phosphatase: 67 IU/L (ref 48–121)
BUN/Creatinine Ratio: 24 — ABNORMAL HIGH (ref 9–23)
BUN: 34 mg/dL — ABNORMAL HIGH (ref 6–24)
Bilirubin Total: 0.4 mg/dL (ref 0.0–1.2)
CO2: 18 mmol/L — ABNORMAL LOW (ref 20–29)
Calcium: 10.9 mg/dL — ABNORMAL HIGH (ref 8.7–10.2)
Chloride: 97 mmol/L (ref 96–106)
Creatinine, Ser: 1.42 mg/dL — ABNORMAL HIGH (ref 0.57–1.00)
GFR calc Af Amer: 50 mL/min/{1.73_m2} — ABNORMAL LOW (ref >59)
GFR calc non Af Amer: 43 mL/min/{1.73_m2} — ABNORMAL LOW (ref >59)
Globulin, Total: 2.3 g/dL (ref 1.5–4.5)
Glucose: 102 mg/dL — ABNORMAL HIGH (ref 65–99)
Potassium: 4.9 mmol/L (ref 3.5–5.2)
Sodium: 134 mmol/L (ref 134–144)
Total Protein: 7.3 g/dL (ref 6.0–8.5)

## 2019-08-03 LAB — LDL CHOLESTEROL, DIRECT: LDL Direct: 269 mg/dL — ABNORMAL HIGH (ref 0–99)

## 2019-08-03 NOTE — Telephone Encounter (Signed)
Left message about further workup for abnormal liver tests and repeating u/s

## 2019-08-03 NOTE — Progress Notes (Signed)
Orders for w/u of abnormal LFTs.

## 2019-08-13 ENCOUNTER — Telehealth: Payer: Self-pay | Admitting: Radiology

## 2019-08-13 NOTE — Telephone Encounter (Signed)
Called and left 3rd voicemail for patient to [facility], called 08/07/19, 08/08/19 and 08/13/19. Explained that Dr Shawnie Pons would like for patient to have lab drawn in 3-4 weeks and an ultrasound of her liver.

## 2019-08-25 ENCOUNTER — Other Ambulatory Visit: Payer: Self-pay | Admitting: Family Medicine

## 2019-08-25 DIAGNOSIS — I10 Essential (primary) hypertension: Secondary | ICD-10-CM

## 2019-09-27 ENCOUNTER — Other Ambulatory Visit: Payer: Self-pay | Admitting: Family Medicine

## 2019-09-27 DIAGNOSIS — I10 Essential (primary) hypertension: Secondary | ICD-10-CM

## 2019-11-01 ENCOUNTER — Other Ambulatory Visit: Payer: Self-pay | Admitting: Obstetrics & Gynecology

## 2019-11-01 DIAGNOSIS — I10 Essential (primary) hypertension: Secondary | ICD-10-CM

## 2019-12-08 ENCOUNTER — Other Ambulatory Visit: Payer: Self-pay | Admitting: Obstetrics & Gynecology

## 2019-12-08 DIAGNOSIS — I10 Essential (primary) hypertension: Secondary | ICD-10-CM

## 2020-01-15 ENCOUNTER — Encounter: Payer: Self-pay | Admitting: Radiology

## 2020-03-25 ENCOUNTER — Encounter: Payer: Self-pay | Admitting: Radiology

## 2020-05-16 ENCOUNTER — Ambulatory Visit: Payer: 59 | Admitting: Internal Medicine

## 2020-05-16 ENCOUNTER — Telehealth (INDEPENDENT_AMBULATORY_CARE_PROVIDER_SITE_OTHER): Payer: 59 | Admitting: Internal Medicine

## 2020-05-16 ENCOUNTER — Encounter: Payer: Self-pay | Admitting: Internal Medicine

## 2020-05-16 DIAGNOSIS — R251 Tremor, unspecified: Secondary | ICD-10-CM | POA: Diagnosis not present

## 2020-05-16 DIAGNOSIS — R296 Repeated falls: Secondary | ICD-10-CM | POA: Diagnosis not present

## 2020-05-16 DIAGNOSIS — R200 Anesthesia of skin: Secondary | ICD-10-CM | POA: Diagnosis not present

## 2020-05-16 NOTE — Progress Notes (Signed)
Virtual Visit via Telephone Note  I connected with DYANNA SEITER, on 05/16/2020 at 11:28 AM by telephone due to the COVID-19 pandemic and verified that I am speaking with the correct person using two identifiers.   Consent: I discussed the limitations, risks, security and privacy concerns of performing an evaluation and management service by telephone and the availability of in person appointments. I also discussed with the patient that there may be a patient responsible charge related to this service. The patient expressed understanding and agreed to proceed.   Location of Patient: Home   Location of Provider: Clinic    Persons participating in Telemedicine visit: Petula Rotolo Dr. Earlene Plater     History of Present Illness: Patient has a acute concerns. Reports that symptoms started 6-8 months ago with numbness in her left arm. Thought it was a pinched nerve. However, progressed to having frequent falls. Currently falling every other day. Feels like her legs give out. No sensation of the room spinning or lightheadedness. Also having episodes of shaking that occur all over the body and lasts for several hours. Does endorse some vision changes like looking at things underwater and vision being wavy. MRI was done 23 years ago and was told had signs on imaging that could be concerning for early signs of MS. At that time, was having numbness in her feet.    Past Medical History:  Diagnosis Date  . Allergy   . Ileus, postoperative (HCC) 01/25/2014   Allergies  Allergen Reactions  . Codeine Rash    Current Outpatient Medications on File Prior to Visit  Medication Sig Dispense Refill  . acetaminophen (TYLENOL) 325 MG tablet Take 2 tablets (650 mg total) by mouth every 6 (six) hours as needed for mild pain, moderate pain or fever (or Temp > 100).    . Ibuprofen 200 MG CAPS Take 400 mg by mouth daily as needed (pain). (Patient not taking: Reported on 05/16/2020)    .  lisinopril-hydrochlorothiazide (ZESTORETIC) 20-12.5 MG tablet TAKE 1 TABLET BY MOUTH EVERY DAY (Patient not taking: Reported on 05/16/2020) 30 tablet 1  . saccharomyces boulardii (FLORASTOR) 250 MG capsule Take 1 twice a day for a week after you finish antibiotics. (Patient not taking: No sig reported)     No current facility-administered medications on file prior to visit.    Observations/Objective: NAD. Speaking clearly.  Work of breathing normal.  Alert and oriented. Mood appropriate.   Assessment and Plan: 1. Frequent falls 2. Numbness 3. Tremors of nervous system Symptoms concerning for MS or other neurological abnormality such as brain mass. Given chronicity of symptoms, not concerned for acute CVA. Will place urgent referral to neurology and defer to them for imaging that is warranted.  - Ambulatory referral to Neurology   Follow Up Instructions: PRN and for routine medical care; Neuro referral    I discussed the assessment and treatment plan with the patient. The patient was provided an opportunity to ask questions and all were answered. The patient agreed with the plan and demonstrated an understanding of the instructions.   The patient was advised to call back or seek an in-person evaluation if the symptoms worsen or if the condition fails to improve as anticipated.     I provided 9 minutes total of non-face-to-face time during this encounter including median intraservice time, reviewing previous notes, investigations, ordering medications, medical decision making, coordinating care and patient verbalized understanding at the end of the visit.    Marcy Siren,  D.O. Primary Care at [facility]  05/16/2020, 11:28 AM

## 2020-05-16 NOTE — Progress Notes (Signed)
Frequent falls  Tremors for 6-8 hours and becomes diaphoretic and unable to sleep  Would like a referral to Neurology Had MRI before, 20 yr ago and r/t impression to early indication to MS

## 2020-05-23 ENCOUNTER — Encounter: Payer: Self-pay | Admitting: Neurology

## 2020-07-03 NOTE — Progress Notes (Signed)
NEUROLOGY CONSULTATION NOTE  Hannah Farmer MRN: [id number] DOB: [birth year]  Referring provider: Marcy Siren, DO Primary care provider: Marcy Siren, DO  Reason for consult:  Numbness, tremors, falls  Assessment/Plan:   1.  Numbness, tremors, falls - remote MRI indicated possible early signs of MS.  1.  MRI of brain/cervical/thoracic spine with and without contrast 2.  Check B12 3.  Follow up afterwards.  Further recommendations pending results.   Subjective:  Hannah Farmer is a 50 year old right-handed female who presents for numbness, tremors and falls  History supplemented by referring provider's note.  In late 2021, she developed numbness in her left arm like fell asleep.  Thought it was pinched nerve.  She then started having frequent falls because her knees would give out.  About 6 months ago, she began experiencing episodes where her entire body would tremor, associated with diaphoresis, nausea and inability to keep food or drink down.  It would last up to 12 hours.  It has been occurring every 2 to 3 weeks.  Her last episode was on Saturday and she has had a dull right sided headache since then.    In 1999, she was experiencing numbness in her feet.  She had an MRI at that time and was told there were findings concerning for possible MS.  She reports history of 6 severe migraines in her life.  No family history of MS  TSH from last year was 0.712.  Vit D was mildly low (27).  PAST MEDICAL HISTORY: Past Medical History:  Diagnosis Date  . Allergy   . Ileus, postoperative (HCC) 01/25/2014    PAST SURGICAL HISTORY: Past Surgical History:  Procedure Laterality Date  . ABDOMINAL HYSTERECTOMY    . CESAREAN SECTION     x 2  . LAPAROSCOPIC APPENDECTOMY N/A 01/20/2014   Procedure: APPENDECTOMY LAPAROSCOPIC;  Surgeon: Emelia Loron, MD;  Location: [facility];  Service: General;  Laterality: N/A;    MEDICATIONS: Current Outpatient Medications on File  Prior to Visit  Medication Sig Dispense Refill  . acetaminophen (TYLENOL) 325 MG tablet Take 2 tablets (650 mg total) by mouth every 6 (six) hours as needed for mild pain, moderate pain or fever (or Temp > 100).    . Ibuprofen 200 MG CAPS Take 400 mg by mouth daily as needed (pain). (Patient not taking: Reported on 05/16/2020)    . lisinopril-hydrochlorothiazide (ZESTORETIC) 20-12.5 MG tablet TAKE 1 TABLET BY MOUTH EVERY DAY (Patient not taking: Reported on 05/16/2020) 30 tablet 1  . saccharomyces boulardii (FLORASTOR) 250 MG capsule Take 1 twice a day for a week after you finish antibiotics. (Patient not taking: No sig reported)     No current facility-administered medications on file prior to visit.    ALLERGIES: Allergies  Allergen Reactions  . Codeine Rash    FAMILY HISTORY: Family History  Problem Relation Age of Onset  . Macular degeneration Mother   . Heart disease Father   . Hypertension Father   . Stroke Father   . Cancer Father        basal/squamous skin cancers    Objective:  Blood pressure (!) 166/107, pulse 85, height 5\' 2"  (1.575 m), weight 153 lb 3.2 oz (69.5 kg), SpO2 97 %. General: No acute distress.  Patient appears well-groomed.   Head:  Normocephalic/atraumatic Eyes:  fundi examined but not visualized Neck: supple, no paraspinal tenderness, full range of motion Back: No paraspinal tenderness Heart: regular rate and rhythm Lungs: Clear to  auscultation bilaterally. Vascular: No carotid bruits. Neurological Exam: Mental status: alert and oriented to person, place, and time, recent and remote memory intact, fund of knowledge intact, attention and concentration intact, speech fluent and not dysarthric, language intact. Cranial nerves: CN I: not tested CN II: pupils equal, round and reactive to light, visual fields intact CN III, IV, VI:  full range of motion, no nystagmus, no ptosis CN V: facial sensation intact. CN VII: upper and lower face symmetric CN  VIII: hearing intact CN IX, X: gag intact, uvula midline CN XI: sternocleidomastoid and trapezius muscles intact CN XII: tongue midline Bulk & Tone: normal, no fasciculations. Motor:  muscle strength 5/5 throughout Sensation:  Pinprick, temperature and vibratory sensation intact. Deep Tendon Reflexes:  2+ throughout,  toes downgoing.   Finger to nose testing:  Without dysmetria.   Heel to shin:  Without dysmetria.   Gait:  Normal station and stride.  Romberg negative.    Thank you for allowing me to take part in the care of this patient.  Shon Millet, DO  CC: Marcy Siren, DO

## 2020-07-04 ENCOUNTER — Other Ambulatory Visit: Payer: Self-pay

## 2020-07-04 ENCOUNTER — Encounter: Payer: Self-pay | Admitting: Neurology

## 2020-07-04 ENCOUNTER — Ambulatory Visit: Payer: 59 | Admitting: Neurology

## 2020-07-04 VITALS — BP 166/107 | HR 85 | Ht 62.0 in | Wt 153.2 lb

## 2020-07-04 DIAGNOSIS — R251 Tremor, unspecified: Secondary | ICD-10-CM

## 2020-07-04 DIAGNOSIS — R296 Repeated falls: Secondary | ICD-10-CM | POA: Diagnosis not present

## 2020-07-04 DIAGNOSIS — R200 Anesthesia of skin: Secondary | ICD-10-CM

## 2020-07-04 DIAGNOSIS — R9089 Other abnormal findings on diagnostic imaging of central nervous system: Secondary | ICD-10-CM | POA: Diagnosis not present

## 2020-07-04 NOTE — Patient Instructions (Addendum)
1.  Check B12 level 2. Check MRI of brain/cervical/thoracic spine with and without contrST 3.  Follow up after testing.  Further recommendations pending results. 4.  Follow up with PCP regarding blood pressure.   We have sent a referral to [facility] for your MRI and they will call you directly to schedule your appointment. They are located at [address] [place]. If you need to contact them directly please call [phone]. Your provider has requested that you have labwork completed today. Please go to [facility] ([id number]) on the second floor of this building before leaving the office today. You do not need to check in. If you are not called within 15 minutes please check with the front desk.

## 2020-07-07 ENCOUNTER — Other Ambulatory Visit (INDEPENDENT_AMBULATORY_CARE_PROVIDER_SITE_OTHER): Payer: 59

## 2020-07-07 ENCOUNTER — Other Ambulatory Visit: Payer: Self-pay

## 2020-07-07 DIAGNOSIS — R200 Anesthesia of skin: Secondary | ICD-10-CM

## 2020-07-07 DIAGNOSIS — R251 Tremor, unspecified: Secondary | ICD-10-CM

## 2020-07-07 LAB — VITAMIN B12: Vitamin B-12: 723 pg/mL (ref 211–911)

## 2020-07-07 NOTE — Progress Notes (Signed)
LMOVM to call back 

## 2020-07-08 ENCOUNTER — Telehealth: Payer: Self-pay | Admitting: Neurology

## 2020-07-08 NOTE — Telephone Encounter (Signed)
Patient is returning a missed call.

## 2020-07-08 NOTE — Progress Notes (Signed)
Pt advised of her Lab results. 

## 2020-07-08 NOTE — Telephone Encounter (Signed)
See Results note.  

## 2020-07-20 ENCOUNTER — Other Ambulatory Visit: Payer: 59

## 2020-07-23 ENCOUNTER — Ambulatory Visit
Admission: RE | Admit: 2020-07-23 | Discharge: 2020-07-23 | Disposition: A | Discharge status: Home / Self Care | Payer: 59 | Source: Ambulatory Visit | Attending: Neurology | Admitting: Neurology

## 2020-07-23 DIAGNOSIS — R9089 Other abnormal findings on diagnostic imaging of central nervous system: Secondary | ICD-10-CM

## 2020-07-23 DIAGNOSIS — R296 Repeated falls: Secondary | ICD-10-CM

## 2020-07-23 DIAGNOSIS — R200 Anesthesia of skin: Secondary | ICD-10-CM

## 2020-07-23 DIAGNOSIS — R251 Tremor, unspecified: Secondary | ICD-10-CM

## 2020-07-23 MED ORDER — GADOBENATE DIMEGLUMINE 529 MG/ML IV SOLN
13.0000 mL | Freq: Once | INTRAVENOUS | Status: AC | PRN
  Administered 2020-07-23: 13 mL via INTRAVENOUS

## 2020-07-25 ENCOUNTER — Ambulatory Visit
Admission: RE | Admit: 2020-07-25 | Discharge: 2020-07-25 | Disposition: A | Discharge status: Home / Self Care | Payer: 59 | Source: Ambulatory Visit | Attending: Neurology | Admitting: Neurology

## 2020-07-25 ENCOUNTER — Other Ambulatory Visit: Payer: 59

## 2020-07-25 ENCOUNTER — Other Ambulatory Visit: Payer: Self-pay

## 2020-07-25 DIAGNOSIS — R9089 Other abnormal findings on diagnostic imaging of central nervous system: Secondary | ICD-10-CM

## 2020-07-25 DIAGNOSIS — R296 Repeated falls: Secondary | ICD-10-CM

## 2020-07-25 DIAGNOSIS — R200 Anesthesia of skin: Secondary | ICD-10-CM

## 2020-07-25 DIAGNOSIS — R251 Tremor, unspecified: Secondary | ICD-10-CM

## 2020-07-25 MED ORDER — GADOBENATE DIMEGLUMINE 529 MG/ML IV SOLN
14.0000 mL | Freq: Once | INTRAVENOUS | Status: AC | PRN
  Administered 2020-07-25: 14 mL via INTRAVENOUS

## 2020-07-28 ENCOUNTER — Telehealth: Payer: Self-pay | Admitting: Neurology

## 2020-07-28 ENCOUNTER — Telehealth: Payer: Self-pay

## 2020-07-28 NOTE — Telephone Encounter (Signed)
Patient called in after getting a voicemail asking for her to call us back.

## 2020-07-28 NOTE — Telephone Encounter (Signed)
Patient returned a call for results. She left a voice mail.

## 2020-07-28 NOTE — Telephone Encounter (Signed)
Per pt wanted to know what to do next?

## 2020-07-28 NOTE — Progress Notes (Signed)
LMOVM for pt to call back 

## 2020-07-28 NOTE — Telephone Encounter (Signed)
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  However, to be sure, it is recommended to repeat MRI of brain with and without contrast in 3 months to make sure there are no changes.  Otherwise, there are other "spots" on the brain which are really nonspecific and do not look like MS.  I would like to see what the MRI of the spinal cord looks like before making further recommendations.

## 2020-07-29 ENCOUNTER — Ambulatory Visit: Payer: 59 | Admitting: Neurology

## 2020-07-29 NOTE — Telephone Encounter (Signed)
I would recommend nerve study of legs to evaluate for nerve problems - involves receiving shocks (like static shock) followed by sticking a needle into the muscles.

## 2020-07-29 NOTE — Telephone Encounter (Signed)
LMOVM for pt to call us back.

## 2020-08-11 ENCOUNTER — Other Ambulatory Visit: Payer: Self-pay

## 2020-08-11 DIAGNOSIS — R200 Anesthesia of skin: Secondary | ICD-10-CM

## 2020-08-11 DIAGNOSIS — R9089 Other abnormal findings on diagnostic imaging of central nervous system: Secondary | ICD-10-CM

## 2020-08-11 DIAGNOSIS — R251 Tremor, unspecified: Secondary | ICD-10-CM

## 2020-08-11 NOTE — Telephone Encounter (Signed)
Could you possible call her, she is crying and has more questions that I cant answer,she is balling on the phone, please advise. Thanks.

## 2020-08-11 NOTE — Telephone Encounter (Signed)
Patient called again to get her results. Patient is anxious.

## 2020-08-11 NOTE — Telephone Encounter (Signed)
Pt is sch for both appts

## 2020-08-11 NOTE — Telephone Encounter (Signed)
I called and spoke with the patient.  I explained the MRI results.  The plan now is:  MRI of brain with and without contrast in 2 1/2 months to follow up on abnormality from recent MRI  Routine EEG to evaluate for tremor  EMG of left arm to evaluate left arm numbness Also, I would like to schedule the patient for follow up after repeat MRI of brain

## 2020-08-11 NOTE — Telephone Encounter (Signed)
Patient called again to get her results. Patient is anxious. 

## 2020-08-11 NOTE — Telephone Encounter (Signed)
Hey, can you schedule patient for EMG and EEG.

## 2020-08-18 ENCOUNTER — Ambulatory Visit: Payer: 59 | Admitting: Neurology

## 2020-08-18 ENCOUNTER — Other Ambulatory Visit: Payer: Self-pay

## 2020-08-18 DIAGNOSIS — R251 Tremor, unspecified: Secondary | ICD-10-CM

## 2020-08-18 DIAGNOSIS — R200 Anesthesia of skin: Secondary | ICD-10-CM

## 2020-08-18 NOTE — Procedures (Signed)
ELECTROENCEPHALOGRAM REPORT  Date of Study: 08/18/2020  Patient's Name: Hannah Farmer MRN: [id number] Date of Birth: 1970/04/01   Clinical History: 51 year old female with recurrent episodes of full-body tremors as well as numbness.  Medications: Acetaminophen ibuprofen  Technical Summary: A multichannel digital EEG recording measured by the international 10-20 system with electrodes applied with paste and impedances below 5000 ohms performed in our laboratory with EKG monitoring in an awake and asleep patient.  Photic stimulation was performed.  The digital EEG was referentially recorded, reformatted, and digitally filtered in a variety of bipolar and referential montages for optimal display.    Description: The patient is awake and asleep during the recording.  During maximal wakefulness, there is a symmetric, medium voltage 10 Hz posterior dominant rhythm that attenuates with eye opening.  The record is symmetric.  During drowsiness and sleep, there is an increase in theta slowing of the background.  Vertex waves and symmetric sleep spindles were seen.  Photic stimulation did not elicit any abnormalities.  There were no epileptiform discharges or electrographic seizures seen.    EKG lead was unremarkable.  Impression: This awake and asleep EEG is normal.    Clinical Correlation: A normal EEG does not exclude a clinical diagnosis of epilepsy.  If further clinical questions remain, prolonged EEG may be helpful.  Clinical correlation is advised.   Shon Millet, DO

## 2020-08-19 NOTE — Telephone Encounter (Signed)
LMOVM to call us back in regards to results.

## 2020-08-21 ENCOUNTER — Telehealth: Payer: Self-pay

## 2020-08-21 NOTE — Telephone Encounter (Signed)
-----   Message from Drema Dallas, DO sent at 08/18/2020  3:50 PM EDT ----- EEG is normal

## 2020-08-21 NOTE — Telephone Encounter (Signed)
Called patient and informed of normal EEG. Patient had no questions or concerns.

## 2020-09-10 ENCOUNTER — Other Ambulatory Visit: Payer: Self-pay | Admitting: Neurology

## 2020-09-10 DIAGNOSIS — R251 Tremor, unspecified: Secondary | ICD-10-CM

## 2020-09-19 ENCOUNTER — Other Ambulatory Visit: Payer: Self-pay

## 2020-09-19 ENCOUNTER — Emergency Department (HOSPITAL_BASED_OUTPATIENT_CLINIC_OR_DEPARTMENT_OTHER): Payer: 59

## 2020-09-19 ENCOUNTER — Inpatient Hospital Stay (HOSPITAL_BASED_OUTPATIENT_CLINIC_OR_DEPARTMENT_OTHER)
Admission: EM | Admit: 2020-09-19 | Discharge: 2020-09-28 | DRG: 439 | Disposition: A | Discharge status: Home / Self Care | Payer: 59 | Attending: Internal Medicine | Admitting: Internal Medicine

## 2020-09-19 ENCOUNTER — Encounter (HOSPITAL_BASED_OUTPATIENT_CLINIC_OR_DEPARTMENT_OTHER): Payer: Self-pay | Admitting: *Deleted

## 2020-09-19 DIAGNOSIS — Z885 Allergy status to narcotic agent status: Secondary | ICD-10-CM | POA: Diagnosis not present

## 2020-09-19 DIAGNOSIS — E781 Pure hyperglyceridemia: Secondary | ICD-10-CM | POA: Diagnosis present

## 2020-09-19 DIAGNOSIS — R062 Wheezing: Secondary | ICD-10-CM

## 2020-09-19 DIAGNOSIS — F10131 Alcohol abuse with withdrawal delirium: Secondary | ICD-10-CM | POA: Diagnosis not present

## 2020-09-19 DIAGNOSIS — F10232 Alcohol dependence with withdrawal with perceptual disturbance: Secondary | ICD-10-CM | POA: Diagnosis not present

## 2020-09-19 DIAGNOSIS — F1020 Alcohol dependence, uncomplicated: Secondary | ICD-10-CM | POA: Diagnosis not present

## 2020-09-19 DIAGNOSIS — I10 Essential (primary) hypertension: Secondary | ICD-10-CM | POA: Diagnosis present

## 2020-09-19 DIAGNOSIS — E876 Hypokalemia: Secondary | ICD-10-CM | POA: Diagnosis present

## 2020-09-19 DIAGNOSIS — R112 Nausea with vomiting, unspecified: Secondary | ICD-10-CM

## 2020-09-19 DIAGNOSIS — Z79899 Other long term (current) drug therapy: Secondary | ICD-10-CM | POA: Diagnosis not present

## 2020-09-19 DIAGNOSIS — D696 Thrombocytopenia, unspecified: Secondary | ICD-10-CM | POA: Diagnosis present

## 2020-09-19 DIAGNOSIS — K760 Fatty (change of) liver, not elsewhere classified: Secondary | ICD-10-CM | POA: Diagnosis present

## 2020-09-19 DIAGNOSIS — Z7141 Alcohol abuse counseling and surveillance of alcoholic: Secondary | ICD-10-CM

## 2020-09-19 DIAGNOSIS — K5900 Constipation, unspecified: Secondary | ICD-10-CM | POA: Diagnosis present

## 2020-09-19 DIAGNOSIS — Z90710 Acquired absence of both cervix and uterus: Secondary | ICD-10-CM | POA: Diagnosis not present

## 2020-09-19 DIAGNOSIS — R7989 Other specified abnormal findings of blood chemistry: Secondary | ICD-10-CM | POA: Diagnosis present

## 2020-09-19 DIAGNOSIS — R945 Abnormal results of liver function studies: Secondary | ICD-10-CM | POA: Diagnosis present

## 2020-09-19 DIAGNOSIS — E7800 Pure hypercholesterolemia, unspecified: Secondary | ICD-10-CM | POA: Diagnosis present

## 2020-09-19 DIAGNOSIS — K859 Acute pancreatitis without necrosis or infection, unspecified: Secondary | ICD-10-CM | POA: Diagnosis present

## 2020-09-19 DIAGNOSIS — Z8249 Family history of ischemic heart disease and other diseases of the circulatory system: Secondary | ICD-10-CM | POA: Diagnosis not present

## 2020-09-19 DIAGNOSIS — Z808 Family history of malignant neoplasm of other organs or systems: Secondary | ICD-10-CM | POA: Diagnosis not present

## 2020-09-19 DIAGNOSIS — K852 Alcohol induced acute pancreatitis without necrosis or infection: Principal | ICD-10-CM | POA: Diagnosis present

## 2020-09-19 DIAGNOSIS — D72819 Decreased white blood cell count, unspecified: Secondary | ICD-10-CM | POA: Diagnosis present

## 2020-09-19 DIAGNOSIS — F10239 Alcohol dependence with withdrawal, unspecified: Secondary | ICD-10-CM | POA: Diagnosis not present

## 2020-09-19 DIAGNOSIS — Z20822 Contact with and (suspected) exposure to covid-19: Secondary | ICD-10-CM | POA: Diagnosis present

## 2020-09-19 DIAGNOSIS — F10231 Alcohol dependence with withdrawal delirium: Secondary | ICD-10-CM | POA: Diagnosis not present

## 2020-09-19 DIAGNOSIS — R443 Hallucinations, unspecified: Secondary | ICD-10-CM | POA: Diagnosis present

## 2020-09-19 DIAGNOSIS — D539 Nutritional anemia, unspecified: Secondary | ICD-10-CM | POA: Diagnosis present

## 2020-09-19 DIAGNOSIS — Z823 Family history of stroke: Secondary | ICD-10-CM

## 2020-09-19 DIAGNOSIS — E860 Dehydration: Secondary | ICD-10-CM

## 2020-09-19 LAB — URINALYSIS, ROUTINE W REFLEX MICROSCOPIC
Bilirubin Urine: NEGATIVE
Glucose, UA: 100 mg/dL — AB
Hgb urine dipstick: NEGATIVE
Ketones, ur: >80 mg/dL — AB
Leukocytes,Ua: NEGATIVE
Nitrite: NEGATIVE
Protein, ur: 30 mg/dL — AB
Specific Gravity, Urine: >1.046 — ABNORMAL HIGH (ref 1.005–1.030)
pH: 6 (ref 5.0–8.0)

## 2020-09-19 LAB — CBC WITH DIFFERENTIAL/PLATELET
Abs Immature Granulocytes: 0.02 10*3/uL (ref 0.00–0.07)
Basophils Absolute: 0 10*3/uL (ref 0.0–0.1)
Basophils Relative: 0 %
Eosinophils Absolute: 0 10*3/uL (ref 0.0–0.5)
Eosinophils Relative: 0 %
HCT: 40.1 % (ref 36.0–46.0)
Hemoglobin: 13.4 g/dL (ref 12.0–15.0)
Immature Granulocytes: 0 %
Lymphocytes Relative: 8 %
Lymphs Abs: 0.4 10*3/uL — ABNORMAL LOW (ref 0.7–4.0)
MCH: 33.1 pg (ref 26.0–34.0)
MCHC: 33.4 g/dL (ref 30.0–36.0)
MCV: 99 fL (ref 80.0–100.0)
Monocytes Absolute: 0.3 10*3/uL (ref 0.1–1.0)
Monocytes Relative: 6 %
Neutro Abs: 3.9 10*3/uL (ref 1.7–7.7)
Neutrophils Relative %: 86 %
Platelets: 147 10*3/uL — ABNORMAL LOW (ref 150–400)
RBC: 4.05 MIL/uL (ref 3.87–5.11)
RDW: 13.5 % (ref 11.5–15.5)
WBC: 4.6 10*3/uL (ref 4.0–10.5)
nRBC: 0 % (ref 0.0–0.2)

## 2020-09-19 LAB — CBC
HCT: 36.6 % (ref 36.0–46.0)
Hemoglobin: 12.3 g/dL (ref 12.0–15.0)
MCH: 33.7 pg (ref 26.0–34.0)
MCHC: 33.6 g/dL (ref 30.0–36.0)
MCV: 100.3 fL — ABNORMAL HIGH (ref 80.0–100.0)
Platelets: 122 10*3/uL — ABNORMAL LOW (ref 150–400)
RBC: 3.65 MIL/uL — ABNORMAL LOW (ref 3.87–5.11)
RDW: 13.7 % (ref 11.5–15.5)
WBC: 3.1 10*3/uL — ABNORMAL LOW (ref 4.0–10.5)
nRBC: 0 % (ref 0.0–0.2)

## 2020-09-19 LAB — COMPREHENSIVE METABOLIC PANEL
ALT: 138 U/L — ABNORMAL HIGH (ref 0–44)
AST: 137 U/L — ABNORMAL HIGH (ref 15–41)
Albumin: 5.1 g/dL — ABNORMAL HIGH (ref 3.5–5.0)
Alkaline Phosphatase: 100 U/L (ref 38–126)
Anion gap: 33 — ABNORMAL HIGH (ref 5–15)
BUN: 10 mg/dL (ref 6–20)
CO2: 13 mmol/L — ABNORMAL LOW (ref 22–32)
Calcium: 9.4 mg/dL (ref 8.9–10.3)
Chloride: 88 mmol/L — ABNORMAL LOW (ref 98–111)
Creatinine, Ser: 0.91 mg/dL (ref 0.44–1.00)
GFR, Estimated: >60 mL/min (ref >60)
Glucose, Bld: 229 mg/dL — ABNORMAL HIGH (ref 70–99)
Potassium: 3.6 mmol/L (ref 3.5–5.1)
Sodium: 134 mmol/L — ABNORMAL LOW (ref 135–145)
Total Bilirubin: 3.5 mg/dL — ABNORMAL HIGH (ref 0.3–1.2)
Total Protein: 7.7 g/dL (ref 6.5–8.1)

## 2020-09-19 LAB — HEMOGLOBIN A1C
Hgb A1c MFr Bld: 5.7 % — ABNORMAL HIGH (ref 4.8–5.6)
Mean Plasma Glucose: 116.89 mg/dL

## 2020-09-19 LAB — CREATININE, SERUM
Creatinine, Ser: 0.67 mg/dL (ref 0.44–1.00)
GFR, Estimated: >60 mL/min (ref >60)

## 2020-09-19 LAB — RESP PANEL BY RT-PCR (FLU A&B, COVID) ARPGX2
Influenza A by PCR: NEGATIVE
Influenza B by PCR: NEGATIVE
SARS Coronavirus 2 by RT PCR: NEGATIVE

## 2020-09-19 LAB — TRIGLYCERIDES: Triglycerides: 156 mg/dL — ABNORMAL HIGH (ref <150)

## 2020-09-19 LAB — LIPASE, BLOOD: Lipase: 1487 U/L — ABNORMAL HIGH (ref 11–51)

## 2020-09-19 MED ORDER — ENOXAPARIN SODIUM 40 MG/0.4ML IJ SOSY
40.0000 mg | PREFILLED_SYRINGE | INTRAMUSCULAR | Status: DC
  Administered 2020-09-19 – 2020-09-27 (×9): 40 mg via SUBCUTANEOUS
  Filled 2020-09-19 (×9): qty 0.4

## 2020-09-19 MED ORDER — SODIUM CHLORIDE 0.9 % IV SOLN
12.5000 mg | Freq: Four times a day (QID) | INTRAVENOUS | Status: DC | PRN
  Administered 2020-09-19: 12.5 mg via INTRAVENOUS
  Filled 2020-09-19: qty 12.5
  Filled 2020-09-19 (×3): qty 0.5

## 2020-09-19 MED ORDER — LACTATED RINGERS IV SOLN: INTRAVENOUS | Status: DC

## 2020-09-19 MED ORDER — MORPHINE SULFATE (PF) 4 MG/ML IV SOLN
4.0000 mg | Freq: Once | INTRAVENOUS | Status: AC
  Administered 2020-09-19: 4 mg via INTRAVENOUS
  Filled 2020-09-19: qty 1

## 2020-09-19 MED ORDER — ONDANSETRON HCL 4 MG/2ML IJ SOLN
4.0000 mg | Freq: Four times a day (QID) | INTRAMUSCULAR | Status: DC | PRN
  Administered 2020-09-19 – 2020-09-23 (×2): 4 mg via INTRAVENOUS
  Filled 2020-09-19 (×3): qty 2

## 2020-09-19 MED ORDER — HYDROMORPHONE HCL 1 MG/ML IJ SOLN
1.0000 mg | INTRAMUSCULAR | Status: DC | PRN
  Administered 2020-09-19 – 2020-09-20 (×7): 1 mg via INTRAVENOUS
  Filled 2020-09-19 (×7): qty 1

## 2020-09-19 MED ORDER — ONDANSETRON HCL 4 MG/2ML IJ SOLN: 4.0000 mg | Freq: Once | INTRAMUSCULAR | Status: DC

## 2020-09-19 MED ORDER — ONDANSETRON HCL 4 MG PO TABS: 4.0000 mg | ORAL_TABLET | Freq: Four times a day (QID) | ORAL | Status: DC | PRN

## 2020-09-19 MED ORDER — HYDROMORPHONE HCL 1 MG/ML IJ SOLN
INTRAMUSCULAR | Status: AC
  Filled 2020-09-19: qty 1

## 2020-09-19 MED ORDER — PROMETHAZINE HCL 25 MG/ML IJ SOLN
INTRAMUSCULAR | Status: AC
  Administered 2020-09-19: 25 mg
  Filled 2020-09-19: qty 1

## 2020-09-19 MED ORDER — SODIUM CHLORIDE 0.9 % IV BOLUS
1000.0000 mL | Freq: Once | INTRAVENOUS | Status: AC
  Administered 2020-09-19: 1000 mL via INTRAVENOUS

## 2020-09-19 MED ORDER — HYDROMORPHONE HCL 1 MG/ML IJ SOLN
1.0000 mg | Freq: Once | INTRAMUSCULAR | Status: AC
  Administered 2020-09-19: 1 mg via INTRAVENOUS

## 2020-09-19 MED ORDER — FAMOTIDINE IN NACL 20-0.9 MG/50ML-% IV SOLN
20.0000 mg | Freq: Once | INTRAVENOUS | Status: AC
  Administered 2020-09-19: 20 mg via INTRAVENOUS
  Filled 2020-09-19: qty 50

## 2020-09-19 MED ORDER — HYDROMORPHONE HCL 1 MG/ML IJ SOLN
1.0000 mg | Freq: Once | INTRAMUSCULAR | Status: AC
  Administered 2020-09-19: 1 mg via INTRAVENOUS
  Filled 2020-09-19: qty 1

## 2020-09-19 MED ORDER — IOHEXOL 300 MG/ML  SOLN
100.0000 mL | Freq: Once | INTRAMUSCULAR | Status: AC | PRN
  Administered 2020-09-19: 100 mL via INTRAVENOUS

## 2020-09-19 MED ORDER — HYDROMORPHONE HCL 1 MG/ML IJ SOLN: 1.0000 mg | Freq: Once | INTRAMUSCULAR | Status: DC

## 2020-09-19 NOTE — ED Notes (Signed)
Report called to Amil Amen, RN, [address] at [facility].

## 2020-09-19 NOTE — ED Triage Notes (Signed)
Pt has been having episodes of abd pain with N/V on and off for about 6 months. About 36 hours ago another episode developed, she has been seen for it without answers.

## 2020-09-19 NOTE — H&P (Signed)
History and Physical   Hannah NievesKatherine K Biederman GNF:[id number] DOB: 1970-09-21 DOA: 09/19/2020  Referring MD/NP/PA: Dr. Particia NearingHaviland  PCP: Arvilla MarketWallace, Catherine Lauren, MD   Outpatient Specialists: None  Patient coming from: Home  Chief Complaint: Abdominal pain  HPI: Hannah Farmer is a 50 y.o. female with medical history significant of suspected multiple sclerosis with work-up ongoing, nonspecific tremors, essential hypertension, history of appendectomy with postoperative ileus who has apparently been having significant GI symptoms usually preceded by some unexplained tremors.  Patient has been undergoing outpatient work-up.  She had abdominal ultrasound on July 7 at that time it did not show any significant finding.  Patient was being referred to GI for work-up of this unexplained abdominal pain which she came to the ER today at the [facility] where she was seen and evaluated.  She had epigastric tenderness with LFTs elevated.  Lipase was notably about 1500 so patient diagnosed with acute pancreatitis.  Patient has no history of alcohol abuse.  Denies any known gallbladder disease and recent ultrasound did not show any gallstones although she has elevated LFTs.  Patient therefore being admitted for further evaluation and treatment..  ED Course: Temperature 98.9 blood pressure 160/104, pulse 91 respiratory rate of 19 oxygen sat 96% on room air.  White count 3.1 hemoglobin 12.3 and platelets 122.  Urinalysis essentially negative COVID-19 screen negative.  Sodium 134 potassium 3.6 chloride 88, CO2 31 with glucose 229, gap of 33, lipase 1487, AST 137 ALT 138 total protein 7.7 total bilirubin 3.5.  Triglyceride 156.  Patient being admitted with acute pancreatitis probably related to the hepatic system.  Review of Systems: As per HPI otherwise 10 point review of systems negative.    Past Medical History:  Diagnosis Date   Allergy    Hypertension    Ileus, postoperative (HCC) 01/25/2014    Past Surgical  History:  Procedure Laterality Date   ABDOMINAL HYSTERECTOMY     CESAREAN SECTION     x 2   LAPAROSCOPIC APPENDECTOMY N/A 01/20/2014   Procedure: APPENDECTOMY LAPAROSCOPIC;  Surgeon: Emelia LoronMatthew Wakefield, MD;  Location: [facility];  Service: General;  Laterality: N/A;     reports that she has never smoked. She has never used smokeless tobacco. She reports current alcohol use of about 6.0 standard drinks of alcohol per week. She reports that she does not use drugs.  Allergies  Allergen Reactions   Codeine Rash    Family History  Problem Relation Age of Onset   Macular degeneration Mother    Heart disease Father    Hypertension Father    Stroke Father    Cancer Father        basal/squamous skin cancers     Prior to Admission medications   Medication Sig Start Date End Date Taking? Authorizing Provider  lisinopril-hydrochlorothiazide (ZESTORETIC) 20-12.5 MG tablet TAKE 1 TABLET BY MOUTH EVERY DAY 11/05/19  Yes Anyanwu, Vela ProseUgonna A, MD  ondansetron (ZOFRAN-ODT) 4 MG disintegrating tablet Take 4 mg by mouth every 8 (eight) hours as needed for nausea or vomiting.   Yes [provider]  acetaminophen (TYLENOL) 325 MG tablet Take 2 tablets (650 mg total) by mouth every 6 (six) hours as needed for mild pain, moderate pain or fever (or Temp > 100). Patient not taking: Reported on 07/04/2020 01/23/14   Sherrie GeorgeJennings, Willard, PA-C  Ibuprofen 200 MG CAPS Take 400 mg by mouth daily as needed (pain). Patient not taking: No sig reported    [provider]  saccharomyces boulardii (FLORASTOR) 250  MG capsule Take 1 twice a day for a week after you finish antibiotics. Patient not taking: No sig reported 01/23/14   Sherrie George, [place]    Physical Exam: Vitals:   09/19/20 1423 09/19/20 1552 09/19/20 1700 09/19/20 1831  BP: 122/84 (!) 136/97 (!) 150/93 (!) 153/107  Pulse: 90 91 82 87  Resp: 14 13 17 16   Temp:    98.9 F (37.2 C)  TempSrc:    Oral  SpO2: 99% 100% 99% 99%  Weight:       Height:          Constitutional: Acutely ill looking, no distress Vitals:   09/19/20 1423 09/19/20 1552 09/19/20 1700 09/19/20 1831  BP: 122/84 (!) 136/97 (!) 150/93 (!) 153/107  Pulse: 90 91 82 87  Resp: 14 13 17 16   Temp:    98.9 F (37.2 C)  TempSrc:    Oral  SpO2: 99% 100% 99% 99%  Weight:      Height:       Eyes: PERRL, lids and conjunctivae normal ENMT: Mucous membranes are dry. Posterior pharynx clear of any exudate or lesions.Normal dentition.  Neck: normal, supple, no masses, no thyromegaly Respiratory: clear to auscultation bilaterally, no wheezing, no crackles. Normal respiratory effort. No accessory muscle use.  Cardiovascular: Regular rate and rhythm, no murmurs / rubs / gallops. No extremity edema. 2+ pedal pulses. No carotid bruits.  Abdomen: Epigastric tenderness, no masses palpated. No hepatosplenomegaly. Bowel sounds positive.  Musculoskeletal: no clubbing / cyanosis. No joint deformity upper and lower extremities. Good ROM, no contractures. Normal muscle tone.  Skin: no rashes, lesions, ulcers. No induration Neurologic: CN 2-12 grossly intact. Sensation intact, DTR normal. Strength 5/5 in all 4.  Psychiatric: Normal judgment and insight. Alert and oriented x 3. Normal mood.     Labs on Admission: I have personally reviewed following labs and imaging studies  CBC: Recent Labs  Lab 09/19/20 0930  WBC 4.6  NEUTROABS 3.9  HGB 13.4  HCT 40.1  MCV 99.0  PLT 147*   Basic Metabolic Panel: Recent Labs  Lab 09/19/20 0930  NA 134*  K 3.6  CL 88*  CO2 13*  GLUCOSE 229*  BUN 10  CREATININE 0.91  CALCIUM 9.4   GFR: Estimated Creatinine Clearance: 63.9 mL/min (by C-G formula based on SCr of 0.91 mg/dL). Liver Function Tests: Recent Labs  Lab 09/19/20 0930  AST 137*  ALT 138*  ALKPHOS 100  BILITOT 3.5*  PROT 7.7  ALBUMIN 5.1*   Recent Labs  Lab 09/19/20 0930  LIPASE 1,487*   No results for input(s): AMMONIA in the last 168  hours. Coagulation Profile: No results for input(s): INR, PROTIME in the last 168 hours. Cardiac Enzymes: No results for input(s): CKTOTAL, CKMB, CKMBINDEX, TROPONINI in the last 168 hours. BNP (last 3 results) No results for input(s): PROBNP in the last 8760 hours. HbA1C: Recent Labs    09/19/20 1149  HGBA1C 5.7*   CBG: No results for input(s): GLUCAP in the last 168 hours. Lipid Profile: Recent Labs    09/19/20 0930  TRIG 156*   Thyroid Function Tests: No results for input(s): TSH, T4TOTAL, FREET4, T3FREE, THYROIDAB in the last 72 hours. Anemia Panel: No results for input(s): VITAMINB12, FOLATE, FERRITIN, TIBC, IRON, RETICCTPCT in the last 72 hours. Urine analysis:    Component Value Date/Time   COLORURINE YELLOW 09/19/2020 1202   APPEARANCEUR CLEAR 09/19/2020 1202   LABSPEC >1.046 (H) 09/19/2020 1202   PHURINE 6.0 09/19/2020 1202  GLUCOSEU 100 (A) 09/19/2020 1202   HGBUR NEGATIVE 09/19/2020 1202   BILIRUBINUR NEGATIVE 09/19/2020 1202   BILIRUBINUR neg 01/20/2014 0913   KETONESUR >80 (A) 09/19/2020 1202   PROTEINUR 30 (A) 09/19/2020 1202   UROBILINOGEN 0.2 01/20/2014 1326   UROBILINOGEN 0.2 01/20/2014 0913   NITRITE NEGATIVE 09/19/2020 1202   LEUKOCYTESUR NEGATIVE 09/19/2020 1202   Sepsis Labs: @LABRCNTIP (procalcitonin:4,lacticidven:4) ) Recent Results (from the past 240 hour(s))  Resp Panel by RT-PCR (Flu A&B, Covid) Nasopharyngeal Swab     Status: None   Collection Time: 09/19/20 11:23 AM   Specimen: Nasopharyngeal Swab; Nasopharyngeal(NP) swabs in vial transport medium  Result Value Ref Range Status   SARS Coronavirus 2 by RT PCR NEGATIVE NEGATIVE Final    Comment: (NOTE) SARS-CoV-2 target nucleic acids are NOT DETECTED.  The SARS-CoV-2 RNA is generally detectable in upper respiratory specimens during the acute phase of infection. The lowest concentration of SARS-CoV-2 viral copies this assay can detect is 138 copies/mL. A negative result does not  preclude SARS-Cov-2 infection and should not be used as the sole basis for treatment or other patient management decisions. A negative result may occur with  improper specimen collection/handling, submission of specimen other than nasopharyngeal swab, presence of viral mutation(s) within the areas targeted by this assay, and inadequate number of viral copies(<138 copies/mL). A negative result must be combined with clinical observations, patient history, and epidemiological information. The expected result is Negative.  Fact Sheet for Patients:  09/21/20  Fact Sheet for Healthcare Providers:  BloggerCourse.com  This test is no t yet approved or cleared by the [employer]ious[employer].[place] FDA and  has been authorized for detection and/or diagnosis of SARS-CoV-2 by FDA under an Emergency Use Authorization (EUA). This EUA will remain  in effect (meaning this test can be used) for the duration of the COVID-19 declaration under Section 564(b)(1) of the Act, 21 U.S.C.section 360bbb-3(b)(1), unless the authorization is terminated  or revoked sooner.       Influenza A by PCR NEGATIVE NEGATIVE Final   Influenza B by PCR NEGATIVE NEGATIVE Final    Comment: (NOTE) The Xpert Xpress SARS-CoV-2/FLU/RSV plus assay is intended as an aid in the diagnosis of influenza from Nasopharyngeal swab specimens and should not be used as a sole basis for treatment. Nasal washings and aspirates are unacceptable for Xpert Xpress SARS-CoV-2/FLU/RSV testing.  Fact Sheet for Patients: [facility]  Fact Sheet for Healthcare Providers: BloggerCourse.com  This test is not yet approved or cleared by the Serious[employer]ker.it FDA and has been authorized for detection and/or diagnosis of SARS-CoV-2 by FDA under an Emergency Use Authorization (EUA). This EUA will remain in effect (meaning this test can be used) for the  duration of the COVID-19 declaration under Section 564(b)(1) of the Act, 21 U.S.C. section 360bbb-3(b)(1), unless the authorization is terminated or revoked.  Performed at [facility], [address] [place], [place]      Radiological Exams on Admission: CT ABDOMEN PELVIS W CONTRAST  Result Date: 09/19/2020 CLINICAL DATA:  Abdominal pain.  Pancreatitis suspected EXAM: CT ABDOMEN AND PELVIS WITH CONTRAST TECHNIQUE: Multidetector CT imaging of the abdomen and pelvis was performed using the standard protocol following bolus administration of intravenous contrast. CONTRAST:  09/21/2020 OMNIPAQUE IOHEXOL 300 MG/ML  SOLN COMPARISON:  01/20/2014 FINDINGS: Lower chest: Lung bases are clear. No effusions. Heart is normal size. Hepatobiliary: Severe diffuse fatty infiltration throughout the liver. No focal abnormality. Gallbladder unremarkable. Pancreas: Inflammation/stranding around the pancreas and continuing in the anterior  pararenal spaces, left greater than right compatible with acute pancreatitis. No ductal dilatation. No evidence of pancreatic necrosis. Spleen: No focal abnormality.  Normal size. Adrenals/Urinary Tract: No adrenal abnormality. No focal renal abnormality. No stones or hydronephrosis. Urinary bladder is unremarkable. Stomach/Bowel: Left colonic diverticulosis. No active diverticulitis. Stomach and small bowel unremarkable. Vascular/Lymphatic: No evidence of aneurysm or adenopathy. Reproductive: Prior hysterectomy.  No adnexal masses. Other: No free fluid or free air. Musculoskeletal: No acute bony abnormality. IMPRESSION: Stranding around the pancreas compatible with acute pancreatitis. Severe diffuse hepatic steatosis. Left colonic diverticulosis. Electronically Signed   By: Charlett Nose M.D.   On: 09/19/2020 11:12     Assessment/Plan Principal Problem:   Acute pancreatitis Active Problems:   Hypertension   Hypercholesteremia   Abnormal LFTs     #1  acute pancreatitis: Patient has a picture of possible obstructive jaundice with acute pancreatitis.  We will admit the patient and follow lipase.  N.p.o. for now.  MRCP to evaluate common bile ducts area.  Monitor the LFTs also.  May require GI consultation if abnormal LFTs continue.  Patient has no evidence of hypertriglycerides and denies being an alcoholic.  IV LR  #2 essential hypertension: We will use IV labetalol for blood pressure control.  #3 hyperlipidemia: Patient will be n.p.o. for now.  Continue to monitor  #4 thrombocytopenia with leukopenia: Could be acute reactive.  Monitor closely.  #5 abnormal LFTs: Again evaluated with MRCP  #6 unexplained tremors: Intermittent not prominent.  Defer for outpatient work-up.   DVT prophylaxis: Lovenox Code Status: Full code Family Communication: Husband at bedside Disposition Plan: Home Consults called: None Admission status: Inpatient  Severity of Illness: The appropriate patient status for this patient is INPATIENT. Inpatient status is judged to be reasonable and necessary in order to provide the required intensity of service to ensure the patient's safety. The patient's presenting symptoms, physical exam findings, and initial radiographic and laboratory data in the context of their chronic comorbidities is felt to place them at high risk for further clinical deterioration. Furthermore, it is not anticipated that the patient will be medically stable for discharge from the hospital within 2 midnights of admission. The following factors support the patient status of inpatient.   " The patient's presenting symptoms include abdominal pain nausea with vomiting. " The worrisome physical exam findings include epigastric tenderness. " The initial radiographic and laboratory data are worrisome because of lipase of 1500. " The chronic co-morbidities include essential hypertension.   * I certify that at the point of admission it is my clinical  judgment that the patient will require inpatient hospital care spanning beyond 2 midnights from the point of admission due to high intensity of service, high risk for further deterioration and high frequency of surveillance required.Lonia Blood MD [facility] Pager [phone]  If 7PM-7AM, please contact night-coverage [email or web address] Password [facility]  09/19/2020, 6:50 PM

## 2020-09-19 NOTE — ED Notes (Signed)
Report called to [place] with [facility]. 

## 2020-09-19 NOTE — ED Provider Notes (Signed)
[facility] EMERGENCY DEPT Provider Note   CSN: [id number] Arrival date & time: 09/19/20  0913     History Chief Complaint  Patient presents with   Abdominal Pain    Hannah Farmer is a 50 y.o. female.  Pt presents to the ED today with abd pain and n/v.  Pt has a hx of this and has seen her doctor.  She had an [place] on 7/7 which showed:  FINDINGS:  .  Vascular: The inferior vena cava is patent. The visualized aorta is unremarkable. Aorta measures 2.0 cm proximal, 1.4 cm mid, and 1.2 cm distal. .  Pancreas: Incompletely imaged. Visualized portions are unremarkable. .  Liver: The liver measures 19.6 cm in its craniocaudal dimension. Increased echotexture. Normal contour.. No focal lesions. The portal and hepatic venous systems are patent with antegrade flow. Monophasic hepatic vein signal can be seen with steatosis. .  Gallbladder: No gallstones or sludge. No gallbladder wall thickening or pericholecystic fluid. Negative sonographic Murphy's sign. .  Biliary: The maximum caliber of the visualized common bile duct is 3 mm. No intrahepatic or extrahepatic ductal dilatation.  .  Right kidney: The right kidney measures 10.1 cm in length. Normal size, contour, and echogenicity. No hydronephrosis or perinephric fluid. No focal mass is identified.  .  Peritoneum: No ascites. .  Additional comments: None.  She was referred to GI, but has not made an appt yet.  Pt said she's had n/v and dry heaving for the past 36 hrs.  She has tried zofran, but it has not helped.  Pt has not been on any meds for her stomach.      Past Medical History:  Diagnosis Date   Allergy    Hypertension    Ileus, postoperative (HCC) 01/25/2014    Patient Active Problem List   Diagnosis Date Noted   Acute pancreatitis 09/19/2020   Abnormal LFTs 08/03/2019   Hypercholesteremia 07/03/2019   Hot flashes 05/31/2019   Hypertension 05/31/2019    Past Surgical History:  Procedure Laterality Date    ABDOMINAL HYSTERECTOMY     CESAREAN SECTION     x 2   LAPAROSCOPIC APPENDECTOMY N/A 01/20/2014   Procedure: APPENDECTOMY LAPAROSCOPIC;  Surgeon: Emelia Loron, MD;  Location: [facility];  Service: General;  Laterality: N/A;     OB History     Gravida  3   Para  2   Term  2   Preterm      AB  1   Living  2      SAB  1   IAB      Ectopic      Multiple      Live Births  2           Family History  Problem Relation Age of Onset   Macular degeneration Mother    Heart disease Father    Hypertension Father    Stroke Father    Cancer Father        basal/squamous skin cancers    Social History   Tobacco Use   Smoking status: Never   Smokeless tobacco: Never  Vaping Use   Vaping Use: Never used  Substance Use Topics   Alcohol use: Yes    Alcohol/week: 6.0 standard drinks    Types: 6 Glasses of wine per week   Drug use: No    Home Medications Prior to Admission medications   Medication Sig Start Date End Date Taking? Authorizing Provider  lisinopril-hydrochlorothiazide (ZESTORETIC) 20-12.5  MG tablet TAKE 1 TABLET BY MOUTH EVERY DAY 11/05/19  Yes Anyanwu, Vela Prose A, MD  ondansetron (ZOFRAN-ODT) 4 MG disintegrating tablet Take 4 mg by mouth every 8 (eight) hours as needed for nausea or vomiting.   Yes [provider]  acetaminophen (TYLENOL) 325 MG tablet Take 2 tablets (650 mg total) by mouth every 6 (six) hours as needed for mild pain, moderate pain or fever (or Temp > 100). Patient not taking: Reported on 07/04/2020 01/23/14   Sherrie George, PA-C  Ibuprofen 200 MG CAPS Take 400 mg by mouth daily as needed (pain). Patient not taking: No sig reported    [provider]  saccharomyces boulardii (FLORASTOR) 250 MG capsule Take 1 twice a day for a week after you finish antibiotics. Patient not taking: No sig reported 01/23/14   Sherrie George, PA-C    Allergies    Codeine  Review of Systems   Review of Systems   Gastrointestinal:  Positive for abdominal pain, nausea and vomiting.  All other systems reviewed and are negative.  Physical Exam Updated Vital Signs BP (!) 154/97 (BP Location: Left Arm)   Pulse 81   Temp 98.4 F (36.9 C) (Oral)   Resp 12   Ht 5\' 4"  (1.626 m)   Wt 61.2 kg   SpO2 100%   BMI 23.17 kg/m   Physical Exam Vitals and nursing note reviewed.  Constitutional:      Appearance: She is well-developed.  HENT:     Head: Normocephalic and atraumatic.     Mouth/Throat:     Mouth: Mucous membranes are moist.     Pharynx: Oropharynx is clear.  Eyes:     Extraocular Movements: Extraocular movements intact.     Pupils: Pupils are equal, round, and reactive to light.  Cardiovascular:     Rate and Rhythm: Normal rate and regular rhythm.     Heart sounds: Normal heart sounds.  Pulmonary:     Effort: Pulmonary effort is normal.     Breath sounds: Normal breath sounds.  Abdominal:     General: Abdomen is flat. Bowel sounds are normal.     Palpations: Abdomen is soft.     Tenderness: There is abdominal tenderness in the epigastric area.  Skin:    General: Skin is warm.     Capillary Refill: Capillary refill takes less than 2 seconds.  Neurological:     General: No focal deficit present.     Mental Status: She is alert and oriented to person, place, and time.  Psychiatric:        Mood and Affect: Mood normal.        Behavior: Behavior normal.    ED Results / Procedures / Treatments   Labs (all labs ordered are listed, but only abnormal results are displayed) Labs Reviewed  CBC WITH DIFFERENTIAL/PLATELET - Abnormal; Notable for the following components:      Result Value   Platelets 147 (*)    Lymphs Abs 0.4 (*)    All other components within normal limits  COMPREHENSIVE METABOLIC PANEL - Abnormal; Notable for the following components:   Sodium 134 (*)    Chloride 88 (*)    CO2 13 (*)    Glucose, Bld 229 (*)    Albumin 5.1 (*)    AST 137 (*)    ALT 138 (*)     Total Bilirubin 3.5 (*)    Anion gap 33 (*)    All other components within normal limits  LIPASE,  BLOOD - Abnormal; Notable for the following components:   Lipase 1,487 (*)    All other components within normal limits  RESP PANEL BY RT-PCR (FLU A&B, COVID) [id number]  URINALYSIS, ROUTINE W REFLEX MICROSCOPIC  H PYLORI, IGM, IGG, IGA AB  TRIGLYCERIDES  HEMOGLOBIN A1C    EKG None  Radiology CT ABDOMEN PELVIS W CONTRAST  Result Date: 09/19/2020 CLINICAL DATA:  Abdominal pain.  Pancreatitis suspected EXAM: CT ABDOMEN AND PELVIS WITH CONTRAST TECHNIQUE: Multidetector CT imaging of the abdomen and pelvis was performed using the standard protocol following bolus administration of intravenous contrast. CONTRAST:  OMNIPAQUE IOHEXOL 300 MG/ML  SOLN COMPARISON:  01/20/2014 FINDINGS: Lower chest: Lung bases are clear. No effusions. Heart is normal size. Hepatobiliary: Severe diffuse fatty infiltration throughout the liver. No focal abnormality. Gallbladder unremarkable. Pancreas: Inflammation/stranding around the pancreas and continuing in the anterior pararenal spaces, left greater than right compatible with acute pancreatitis. No ductal dilatation. No evidence of pancreatic necrosis. Spleen: No focal abnormality.  Normal size. Adrenals/Urinary Tract: No adrenal abnormality. No focal renal abnormality. No stones or hydronephrosis. Urinary bladder is unremarkable. Stomach/Bowel: Left colonic diverticulosis. No active diverticulitis. Stomach and small bowel unremarkable. Vascular/Lymphatic: No evidence of aneurysm or adenopathy. Reproductive: Prior hysterectomy.  No adnexal masses. Other: No free fluid or free air. Musculoskeletal: No acute bony abnormality. IMPRESSION: Stranding around the pancreas compatible with acute pancreatitis. Severe diffuse hepatic steatosis. Left colonic diverticulosis. Electronically Signed   By: Charlett Nose M.D.   On: 09/19/2020 11:12    Procedures Procedures   Medications  Ordered in ED Medications  promethazine (PHENERGAN) 12.5 mg in sodium chloride 0.9 % 50 mL IVPB (has no administration in time range)  lactated ringers infusion (has no administration in time range)  sodium chloride 0.9 % bolus 1,000 mL (1,000 mLs Intravenous New Bag/Given 09/19/20 1012)  morphine 4 MG/ML injection 4 mg (4 mg Intravenous Given 09/19/20 1009)  famotidine (PEPCID) IVPB 20 mg premix (20 mg Intravenous New Bag/Given 09/19/20 1027)  promethazine (PHENERGAN) 25 MG/ML injection (25 mg  Given 09/19/20 1008)  iohexol (OMNIPAQUE) 300 MG/ML solution 100 mL (100 mLs Intravenous Contrast Given 09/19/20 1055)  morphine 4 MG/ML injection 4 mg (4 mg Intravenous Given 09/19/20 1130)    ED Course  I have reviewed the triage vital signs and the nursing notes.  Pertinent labs & imaging results that were available during my care of the patient were reviewed by me and considered in my medical decision making (see chart for details).    MDM Rules/Calculators/A&P                          Pt does have acute pancreatitis.  No hx pancreatitis.  She is not a big drinker.  GB looks ok, but bilirubin is elevated.  She may need a MRCP/GI. She is still requiring pain/nausea meds.  She is d/w Dr. Toniann Fail for admission.  Final Clinical Impression(s) / ED Diagnoses Final diagnoses:  Acute pancreatitis, unspecified complication status, unspecified pancreatitis type  Dehydration  Intractable vomiting with nausea, unspecified vomiting type    Rx / DC Orders ED Discharge Orders     None        Jacalyn Lefevre, MD 09/19/20 1149

## 2020-09-19 NOTE — ED Notes (Signed)
Patty - Charge RN aware of pt's BP 

## 2020-09-20 ENCOUNTER — Other Ambulatory Visit: Payer: 59

## 2020-09-20 LAB — LIPASE, BLOOD: Lipase: 431 U/L — ABNORMAL HIGH (ref 11–51)

## 2020-09-20 LAB — COMPREHENSIVE METABOLIC PANEL
ALT: 88 U/L — ABNORMAL HIGH (ref 0–44)
AST: 70 U/L — ABNORMAL HIGH (ref 15–41)
Albumin: 4.1 g/dL (ref 3.5–5.0)
Alkaline Phosphatase: 73 U/L (ref 38–126)
Anion gap: 12 (ref 5–15)
BUN: 8 mg/dL (ref 6–20)
CO2: 27 mmol/L (ref 22–32)
Calcium: 8.8 mg/dL — ABNORMAL LOW (ref 8.9–10.3)
Chloride: 98 mmol/L (ref 98–111)
Creatinine, Ser: 0.52 mg/dL (ref 0.44–1.00)
GFR, Estimated: >60 mL/min (ref >60)
Glucose, Bld: 135 mg/dL — ABNORMAL HIGH (ref 70–99)
Potassium: 3.8 mmol/L (ref 3.5–5.1)
Sodium: 137 mmol/L (ref 135–145)
Total Bilirubin: 3.3 mg/dL — ABNORMAL HIGH (ref 0.3–1.2)
Total Protein: 6.8 g/dL (ref 6.5–8.1)

## 2020-09-20 LAB — CBC
HCT: 36.2 % (ref 36.0–46.0)
Hemoglobin: 11.8 g/dL — ABNORMAL LOW (ref 12.0–15.0)
MCH: 33.3 pg (ref 26.0–34.0)
MCHC: 32.6 g/dL (ref 30.0–36.0)
MCV: 102.3 fL — ABNORMAL HIGH (ref 80.0–100.0)
Platelets: 113 10*3/uL — ABNORMAL LOW (ref 150–400)
RBC: 3.54 MIL/uL — ABNORMAL LOW (ref 3.87–5.11)
RDW: 13.7 % (ref 11.5–15.5)
WBC: 3.8 10*3/uL — ABNORMAL LOW (ref 4.0–10.5)
nRBC: 0 % (ref 0.0–0.2)

## 2020-09-20 LAB — HIV ANTIBODY (ROUTINE TESTING W REFLEX): HIV Screen 4th Generation wRfx: NONREACTIVE

## 2020-09-20 MED ORDER — LABETALOL HCL 5 MG/ML IV SOLN
5.0000 mg | INTRAVENOUS | Status: DC | PRN
  Administered 2020-09-20 – 2020-09-25 (×7): 5 mg via INTRAVENOUS
  Filled 2020-09-20 (×6): qty 4

## 2020-09-20 MED ORDER — BRIMONIDINE TARTRATE 0.2 % OP SOLN
1.0000 [drp] | Freq: Every day | OPHTHALMIC | Status: DC
  Administered 2020-09-21 – 2020-09-28 (×8): 1 [drp] via OPHTHALMIC
  Filled 2020-09-20: qty 5

## 2020-09-20 NOTE — Progress Notes (Signed)
PROGRESS NOTE    Hannah Farmer  ZOX:[id number] DOB: 08-20-1970 DOA: 09/19/2020 PCP: Arvilla MarketWallace, Catherine Lauren, MD   Chief Complaint  Patient presents with   Abdominal Pain   Brief Narrative: 50 year old female with history of suspected multiple sclerosis with work-up ongoing, nondyspneic tremor, hypertension, history of appendicectomy with postop ileus who has been having significant GI symptoms usually preceded by some unexplained tremors and undergoing outpatient work-up.  Had ultrasound of abdomen July 7 that did not show any significant finding and was referred to GI.  But patient came to the ED and drop raise with epigastric tenderness LFTs are elevated lipase with abnormal 1500, CT scan showed evidence of acute pancreatitis- "Stranding around the pancreas compatible with acute pancreatitis.Severe diffuse hepatic steatosis.Left colonic diverticulosis." Patient was admitted  Subjective: Patient feels pain is better Feeling very thirsty wants to drink water . Overnight no fever on room air rest of the vitals stable Lipase LFTs downtrending.  Assessment & Plan:  Abdominal pain with evidence of Acute pancreatitis Unclear etiology.  No evidence of gallstone, no history of alcohol abuse recently- altho was heavy drinker 5 yrs ago and dose occasional drink a glass of wine 2 time /wk.  TG level normal at 156.  Continue on symptomatic management with  CLD,aggressive IV fluid hydration increas to 150 ml/hr RL,  pain management.  LFTs and lipase are improving.  Patient is however on lisinopril also HCTZ which could potentially cause acute pancreatitis, will check autoimmune marker Igg4 for autoimmune pancreatitis. [facility] consulted.  Transaminitis with evidence of severe diffuse hepatic steatosis: Symptomatic management will need to follow-up closely.  LFTs downtrending.  Hypertension: BP is controlled patient is on HCTZ lisinopril and currently on hold  Hypercholesteremia: Not on a  statin  Diet Order             Diet clear liquid Room service appropriate? Yes; Fluid consistency: Thin  Diet effective now                  Patient's Body mass index is 23.17 kg/m.  DVT prophylaxis: enoxaparin (LOVENOX) injection 40 mg Start: 09/19/20 2200 Code Status:   Code Status: Full Code  Family Communication: plan of care discussed with patient at bedside. Status is: Inpatient  Remains inpatient appropriate because:IV treatments appropriate due to intensity of illness or inability to take PO and Inpatient level of care appropriate due to severity of illness Dispo: The patient is from: Home              Anticipated d/c is to: Home              Patient currently is not medically stable to d/c.   Difficult to place patient No Unresulted Labs (From admission, onward)     Start     Ordered   09/26/20 0500  Creatinine, serum  (enoxaparin (LOVENOX)    CrCl >/= 30 ml/min)  Weekly,   R     Comments: while on enoxaparin therapy    09/19/20 1849   09/21/20 0500  Comprehensive metabolic panel  Daily,   R      09/20/20 1004   09/21/20 0500  CBC  Daily,   R      09/20/20 1004   09/21/20 0500  Lipase, blood  Tomorrow morning,   R        09/20/20 1004   09/20/20 0705  IgG 4  Add-on,   AD        09/20/20 [id number]  09/19/20 0939  H Pylori, IGM, IGG, IGA AB  Once,   R        09/19/20 8466          Medications reviewed:  Scheduled Meds:  brimonidine  1 drop Both Eyes Daily   enoxaparin (LOVENOX) injection  40 mg Subcutaneous Q24H    HYDROmorphone (DILAUDID) injection  1 mg Intravenous Once   Continuous Infusions:  lactated ringers 125 mL/hr at 09/20/20 0435   promethazine (PHENERGAN) injection (IM or IVPB) Stopped (09/20/20 0005)   Consultants:see note  Procedures:see note Antimicrobials: Anti-infectives (From admission, onward)    None      Culture/Microbiology No results found for: SDES, SPECREQUEST, CULT, REPTSTATUS  Other culture-see  note  Objective: Vitals: Today's Vitals   09/20/20 0302 09/20/20 0556 09/20/20 0601 09/20/20 0630  BP:  (!) 123/92    Pulse:  74    Resp:  18    Temp:  99.3 F (37.4 C)    TempSrc:  Oral    SpO2:  99%    Weight:      Height:      PainSc: 2   8  2      Intake/Output Summary (Last 24 hours) at 09/20/2020 1007 Last data filed at 09/20/2020 0435 Gross per 24 hour  Intake 2056.11 ml  Output --  Net 2056.11 ml   Filed Weights   09/19/20 0920  Weight: 61.2 kg   Weight change:   Intake/Output from previous day: 07/15 0701 - 07/16 0700 In: 2056.1 [I.V.:1960.8; IV Piggyback:95.3] Out: -  Intake/Output this shift: No intake/output data recorded. Filed Weights   09/19/20 0920  Weight: 61.2 kg   Examination: General exam: AAO 3 , older than stated age, weak appearing. HEENT:Oral mucosa moist, Ear/Nose WNL grossly,dentition normal. Respiratory system: bilaterally clear no use of accessory muscle, non tender. Cardiovascular system: S1 & S2 +,No JVD. Gastrointestinal system: Abdomen soft, central abdomen is tender no rebound, ND, BS+. Nervous System:Alert, awake, moving extremities Extremities: no edema, distal peripheral pulses palpable.  Skin: No rashes,no icterus. MSK: Normal muscle bulk,tone, power Data Reviewed: I have personally reviewed following labs and imaging studies CBC: Recent Labs  Lab 09/19/20 0930 09/19/20 1934 09/20/20 0443  WBC 4.6 3.1* 3.8*  NEUTROABS 3.9  --   --   HGB 13.4 12.3 11.8*  HCT 40.1 36.6 36.2  MCV 99.0 100.3* 102.3*  PLT 147* 122* 113*  Basic Metabolic Panel: Recent Labs  Lab 09/19/20 0930 09/19/20 1934 09/20/20 0443  NA 134*  --  137  K 3.6  --  3.8  CL 88*  --  98  CO2 13*  --  27  GLUCOSE 229*  --  135*  BUN 10  --  8  CREATININE 0.91 0.67 0.52  CALCIUM 9.4  --  8.8*   GFR: Estimated Creatinine Clearance: 72.6 mL/min (by C-G formula based on SCr of 0.52 mg/dL). Liver Function Tests: Recent Labs  Lab 09/19/20 0930  09/20/20 0443  AST 137* 70*  ALT 138* 88*  ALKPHOS 100 73  BILITOT 3.5* 3.3*  PROT 7.7 6.8  ALBUMIN 5.1* 4.1   Recent Labs  Lab 09/19/20 0930 09/20/20 0443  LIPASE 1,487* 431*   No results for input(s): AMMONIA in the last 168 hours. Coagulation Profile: No results for input(s): INR, PROTIME in the last 168 hours. Cardiac Enzymes: No results for input(s): CKTOTAL, CKMB, CKMBINDEX, TROPONINI in the last 168 hours. BNP (last 3 results) No results for input(s): PROBNP in the last  8760 hours. HbA1C: Recent Labs    09/19/20 1149  HGBA1C 5.7*   CBG: No results for input(s): GLUCAP in the last 168 hours. Lipid Profile: Recent Labs    09/19/20 0930  TRIG 156*   Thyroid Function Tests: No results for input(s): TSH, T4TOTAL, FREET4, T3FREE, THYROIDAB in the last 72 hours. Anemia Panel: No results for input(s): VITAMINB12, FOLATE, FERRITIN, TIBC, IRON, RETICCTPCT in the last 72 hours. Sepsis Labs: No results for input(s): PROCALCITON, LATICACIDVEN in the last 168 hours.  Recent Results (from the past 240 hour(s))  Resp Panel by RT-PCR (Flu A&B, Covid) Nasopharyngeal Swab     Status: None   Collection Time: 09/19/20 11:23 AM   Specimen: Nasopharyngeal Swab; Nasopharyngeal(NP) swabs in vial transport medium  Result Value Ref Range Status   SARS Coronavirus 2 by RT PCR NEGATIVE NEGATIVE Final    Comment: (NOTE) SARS-CoV-2 target nucleic acids are NOT DETECTED.  The SARS-CoV-2 RNA is generally detectable in upper respiratory specimens during the acute phase of infection. The lowest concentration of SARS-CoV-2 viral copies this assay can detect is 138 copies/mL. A negative result does not preclude SARS-Cov-2 infection and should not be used as the sole basis for treatment or other patient management decisions. A negative result may occur with  improper specimen collection/handling, submission of specimen other than nasopharyngeal swab, presence of viral mutation(s) within  the areas targeted by this assay, and inadequate number of viral copies(<138 copies/mL). A negative result must be combined with clinical observations, patient history, and epidemiological information. The expected result is Negative.  Fact Sheet for Patients:  BloggerCourse.com  Fact Sheet for Healthcare Providers:  SeriousBroker.it  This test is no t yet approved or cleared by the [place] FDA and  has been authorized for detection and/or diagnosis of SARS-CoV-2 by FDA under an Emergency Use Authorization (EUA). This EUA will remain  in effect (meaning this test can be used) for the duration of the COVID-19 declaration under Section 564(b)(1) of the Act, 21 U.S.C.section 360bbb-3(b)(1), unless the authorization is terminated  or revoked sooner.       Influenza A by PCR NEGATIVE NEGATIVE Final   Influenza B by PCR NEGATIVE NEGATIVE Final    Comment: (NOTE) The Xpert Xpress SARS-CoV-2/FLU/RSV plus assay is intended as an aid in the diagnosis of influenza from Nasopharyngeal swab specimens and should not be used as a sole basis for treatment. Nasal washings and aspirates are unacceptable for Xpert Xpress SARS-CoV-2/FLU/RSV testing.  Fact Sheet for Patients: BloggerCourse.com  Fact Sheet for Healthcare Providers: SeriousBroker.it  This test is not yet approved or cleared by the [place] FDA and has been authorized for detection and/or diagnosis of SARS-CoV-2 by FDA under an Emergency Use Authorization (EUA). This EUA will remain in effect (meaning this test can be used) for the duration of the COVID-19 declaration under Section 564(b)(1) of the Act, 21 U.S.C. section 360bbb-3(b)(1), unless the authorization is terminated or revoked.  Performed at [facility], [address], [place], [place] [address]   Radiology Studies: CT ABDOMEN PELVIS W  CONTRAST  Result Date: 09/19/2020 CLINICAL DATA:  Abdominal pain.  Pancreatitis suspected EXAM: CT ABDOMEN AND PELVIS WITH CONTRAST TECHNIQUE: Multidetector CT imaging of the abdomen and pelvis was performed using the standard protocol following bolus administration of intravenous contrast. CONTRAST:  OMNIPAQUE IOHEXOL 300 MG/ML  SOLN COMPARISON:  01/20/2014 FINDINGS: Lower chest: Lung bases are clear. No effusions. Heart is normal size. Hepatobiliary: Severe diffuse fatty infiltration throughout the  liver. No focal abnormality. Gallbladder unremarkable. Pancreas: Inflammation/stranding around the pancreas and continuing in the anterior pararenal spaces, left greater than right compatible with acute pancreatitis. No ductal dilatation. No evidence of pancreatic necrosis. Spleen: No focal abnormality.  Normal size. Adrenals/Urinary Tract: No adrenal abnormality. No focal renal abnormality. No stones or hydronephrosis. Urinary bladder is unremarkable. Stomach/Bowel: Left colonic diverticulosis. No active diverticulitis. Stomach and small bowel unremarkable. Vascular/Lymphatic: No evidence of aneurysm or adenopathy. Reproductive: Prior hysterectomy.  No adnexal masses. Other: No free fluid or free air. Musculoskeletal: No acute bony abnormality. IMPRESSION: Stranding around the pancreas compatible with acute pancreatitis. Severe diffuse hepatic steatosis. Left colonic diverticulosis. Electronically Signed   By: Charlett Nose M.D.   On: 09/19/2020 11:12     LOS: 1 day   Lanae Boast, MD [facility]  09/20/2020, 10:07 AM

## 2020-09-20 NOTE — Consult Note (Signed)
[facility] Consult  Referring Provider: No ref. provider found Primary Care Physician:  Arvilla Market, MD Primary Gastroenterologist: Gentry Fitz  Reason for Consultation: Pancreatitis  HPI: Hannah Farmer is a 50 y.o. female admitted from the ER with multiple days of dry heaving, shaking tremors, left upper quadrant abdominal pain. Found to have elevated lipase, abnormal CAT scan showing pancreatitis and diffuse hepatic steatosis.   Patient states that for the last 8 months she has had 2 to 3 days of dry heaving, shakiness, loss of balance of unknown etiology, investigated with MRIs of brain and spine without obvious cause noted. She has lost 35 pounds in the last 3 months. When she dry heaves, she does not have any vomiting. This is the first time she has developed epigastric and left upper quadrant abdominal pain, denies radiation of the pain to the back.  Patient states she was a heavy alcohol user until 5 years ago, when she would drink 4-5 vodka shots on a daily basis for at least 5 years.  Thereafter she has stopped hard liquor consumption, drinks wine and spiked seltzer 2-3 times a week.  Last alcohol use was last week.  Patient denies smoking, use of recreational or IV drugs. There is no family history of pancreatitis or pancreatic cancer.  She otherwise denies difficulty swallowing, pain on swallowing, heartburn, acid reflux, rectal bleeding, however states that intermittently she will have black tarry stools, last was noted 2 weeks ago. No prior endoscopy or colonoscopy.  Patient states that she was restarted on lisinopril and hydrochlorothiazide 1 week ago for hypertension.   Past Medical History:  Diagnosis Date   Allergy    Hypertension    Ileus, postoperative (HCC) 01/25/2014    Past Surgical History:  Procedure Laterality Date   ABDOMINAL HYSTERECTOMY     CESAREAN SECTION     x 2   LAPAROSCOPIC APPENDECTOMY N/A 01/20/2014   Procedure:  APPENDECTOMY LAPAROSCOPIC;  Surgeon: Emelia Loron, MD;  Location: [facility] OR[facility];  Service: General;  Laterality: N/A;    Prior to Admission medications   Medication Sig Start Date End Date Taking? Authorizing Provider  Brimonidine Tartrate (LUMIFY) 0.025 % SOLN Place 1 drop into both eyes daily.   Yes [provider]  lisinopril-hydrochlorothiazide (ZESTORETIC) 20-12.5 MG tablet TAKE 1 TABLET BY MOUTH EVERY DAY Patient taking differently: Take 1 tablet by mouth daily. 11/05/19  Yes Anyanwu, Jethro Bastos, MD  ondansetron (ZOFRAN-ODT) 4 MG disintegrating tablet Take 4 mg by mouth every 8 (eight) hours as needed for nausea or vomiting.   Yes [provider]    Current Facility-Administered Medications  Medication Dose Route Frequency Provider Last Rate Last Admin   brimonidine (ALPHAGAN) 0.2 % ophthalmic solution 1 drop  1 drop Both Eyes Daily Kc, Ramesh, MD       enoxaparin (LOVENOX) injection 40 mg  40 mg Subcutaneous Q24H Earlie Lou L, MD   40 mg at 09/19/20 2156   HYDROmorphone (DILAUDID) injection 1 mg  1 mg Intravenous Once Pfeiffer, Lebron Conners, MD       HYDROmorphone (DILAUDID) injection 1 mg  1 mg Intravenous Q2H PRN Earlie Lou L, MD   1 mg at 09/20/20 1146   labetalol (NORMODYNE) injection 5 mg  5 mg Intravenous Q2H PRN Rometta Emery, MD       lactated ringers infusion   Intravenous Continuous Kc, Ramesh, MD 150 mL/hr at 09/20/20 1149 New Bag at 09/20/20 1149   ondansetron (ZOFRAN) tablet 4 mg  4 mg  Oral Q6H PRN Rometta Emery, MD       Or   ondansetron [facility]) injection 4 mg  4 mg Intravenous Q6H PRN Rometta Emery, MD   4 mg at 09/19/20 2154   promethazine (PHENERGAN) 12.5 mg in sodium chloride 0.9 % 50 mL IVPB  12.5 mg Intravenous Q6H PRN Jacalyn Lefevre, MD   Stopped at 09/20/20 0005    Allergies as of 09/19/2020 - Review Complete 09/19/2020  Allergen Reaction Noted   Codeine Rash 01/20/2014    Family History  Problem Relation Age of Onset    Macular degeneration Mother    Heart disease Father    Hypertension Father    Stroke Father    Cancer Father        basal/squamous skin cancers    Social History   Socioeconomic History   Marital status: Married    Spouse name: Not on file   Number of children: Not on file   Years of education: Not on file   Highest education level: Not on file  Occupational History   Not on file  Tobacco Use   Smoking status: Never   Smokeless tobacco: Never  Vaping Use   Vaping Use: Never used  Substance and Sexual Activity   Alcohol use: Yes    Alcohol/week: 6.0 standard drinks    Types: 6 Glasses of wine per week   Drug use: No   Sexual activity: Yes    Birth control/protection: Surgical  Other Topics Concern   Not on file  Social History Narrative   Right handed   Social Determinants of Health   Financial Resource Strain: Not on file  Food Insecurity: Not on file  Transportation Needs: Not on file  Physical Activity: Not on file  Stress: Not on file  Social Connections: Not on file  Intimate Partner Violence: Not on file    Review of Systems: Positive for: GI: Described in detail in HPI.    Gen:  involuntary weight loss, denies any fever, chills, rigors, night sweats, anorexia, fatigue, weakness, malaise, and sleep disorder CV: Denies chest pain, angina, palpitations, syncope, orthopnea, PND, peripheral edema, and claudication. Resp: Denies dyspnea, cough, sputum, wheezing, coughing up blood. GU : Denies urinary burning, blood in urine, urinary frequency, urinary hesitancy, nocturnal urination, and urinary incontinence. MS: Denies joint pain or swelling.  Denies muscle weakness, cramps, atrophy.  Derm: Denies rash, itching, oral ulcerations, hives, unhealing ulcers.  Psych: Denies depression, anxiety, memory loss, suicidal ideation, hallucinations,  and confusion. Heme: Denies bruising, bleeding, and enlarged lymph nodes. Neuro:  Denies any headaches, dizziness,  paresthesias. Endo:  Denies any problems with DM, thyroid, adrenal function.  Physical Exam: Vital signs in last 24 hours: Temp:  [98.6 F (37 C)-99.3 F (37.4 C)] 99.3 F (37.4 C) (07/16 0556) Pulse Rate:  [74-91] 74 (07/16 0556) Resp:  [13-19] 18 (07/16 0556) BP: (122-153)/(84-107) 123/92 (07/16 0556) SpO2:  [96 %-100 %] 99 % (07/16 0556) Last BM Date: 09/16/20  General:   Alert,  Well-developed, well-nourished, pleasant and cooperative in NAD Head:  Normocephalic and atraumatic. Eyes:  Sclera clear, no icterus.   Conjunctiva pink. Ears:  Normal auditory acuity. Nose:  No deformity, discharge,  or lesions. Mouth:  No deformity or lesions.  Oropharynx pink & moist. Neck:  Supple; no masses or thyromegaly. Lungs:  Clear throughout to auscultation.   No wheezes, crackles, or rhonchi. No acute distress. Heart:  Regular rate and rhythm; no murmurs, clicks, rubs,  or gallops.  Extremities:  Without clubbing or edema. Neurologic:  Alert and  oriented x4;  grossly normal neurologically. Skin:  Intact without significant lesions or rashes. Psych:  Alert and cooperative. Normal mood and affect. Abdomen: Soft, mild upper abdominal tenderness, sluggish bowel sounds     Lab Results: Recent Labs    09/19/20 0930 09/19/20 1934 09/20/20 0443  WBC 4.6 3.1* 3.8*  HGB 13.4 12.3 11.8*  HCT 40.1 36.6 36.2  PLT 147* 122* 113*   BMET Recent Labs    09/19/20 0930 09/19/20 1934 09/20/20 0443  NA 134*  --  137  K 3.6  --  3.8  CL 88*  --  98  CO2 13*  --  27  GLUCOSE 229*  --  135*  BUN 10  --  8  CREATININE 0.91 0.67 0.52  CALCIUM 9.4  --  8.8*   LFT Recent Labs    09/20/20 0443  PROT 6.8  ALBUMIN 4.1  AST 70*  ALT 88*  ALKPHOS 73  BILITOT 3.3*   PT/INR No results for input(s): LABPROT, INR in the last 72 hours.  Studies/Results: CT ABDOMEN PELVIS W CONTRAST  Result Date: 09/19/2020 CLINICAL DATA:  Abdominal pain.  Pancreatitis suspected EXAM: CT ABDOMEN AND PELVIS  WITH CONTRAST TECHNIQUE: Multidetector CT imaging of the abdomen and pelvis was performed using the standard protocol following bolus administration of intravenous contrast. CONTRAST:  OMNIPAQUE IOHEXOL 300 MG/ML  SOLN COMPARISON:  01/20/2014 FINDINGS: Lower chest: Lung bases are clear. No effusions. Heart is normal size. Hepatobiliary: Severe diffuse fatty infiltration throughout the liver. No focal abnormality. Gallbladder unremarkable. Pancreas: Inflammation/stranding around the pancreas and continuing in the anterior pararenal spaces, left greater than right compatible with acute pancreatitis. No ductal dilatation. No evidence of pancreatic necrosis. Spleen: No focal abnormality.  Normal size. Adrenals/Urinary Tract: No adrenal abnormality. No focal renal abnormality. No stones or hydronephrosis. Urinary bladder is unremarkable. Stomach/Bowel: Left colonic diverticulosis. No active diverticulitis. Stomach and small bowel unremarkable. Vascular/Lymphatic: No evidence of aneurysm or adenopathy. Reproductive: Prior hysterectomy.  No adnexal masses. Other: No free fluid or free air. Musculoskeletal: No acute bony abnormality. IMPRESSION: Stranding around the pancreas compatible with acute pancreatitis. Severe diffuse hepatic steatosis. Left colonic diverticulosis. Electronically Signed   By: Charlett Nose M.D.   On: 09/19/2020 11:12    Impression: Lipase 1487 on admission, CT showed stranding around pancreas compatible with acute pancreatitis,gallbladder unremarkable without biliary or pancreatic ductal dilatation  Diffuse severe hepatic steatosis,  T bili/AST/ALT/ALP of 3.5/137/138/100 on admission and 3. 3/70/88/73 today   Plan: On clear liquid diet, receiving lactated Ringer's at 150 cc an hour, pain management with Dilaudid 1 mg every 2 hours as needed for severe pain, Phenergan and Zofran as needed for nausea vomiting. Trend LFTs, if rising up may require MRCP for further evaluation.  Cause of  pancreatitis could be related to medications, as so far on imaging there is no evidence of gallstone, although she has history of significant alcohol use, no recent heavy alcohol consumption reported, normal triglycerides noted, primary team has ordered IgG4 levels to evaluate for autoimmune pancreatitis.   LOS: 1 day   Kerin Salen, MD  09/20/2020, 11:53 AM

## 2020-09-20 NOTE — Plan of Care (Signed)
  Problem: Activity: Goal: Risk for activity intolerance will decrease Outcome: Progressing   Problem: Pain Managment: Goal: General experience of comfort will improve Outcome: Progressing   Problem: Safety: Goal: Ability to remain free from injury will improve Outcome: Progressing   

## 2020-09-21 DIAGNOSIS — F10131 Alcohol abuse with withdrawal delirium: Secondary | ICD-10-CM | POA: Diagnosis not present

## 2020-09-21 LAB — COMPREHENSIVE METABOLIC PANEL
ALT: 60 U/L — ABNORMAL HIGH (ref 0–44)
AST: 52 U/L — ABNORMAL HIGH (ref 15–41)
Albumin: 3.5 g/dL (ref 3.5–5.0)
Alkaline Phosphatase: 62 U/L (ref 38–126)
Anion gap: 13 (ref 5–15)
BUN: <5 mg/dL — ABNORMAL LOW (ref 6–20)
CO2: 28 mmol/L (ref 22–32)
Calcium: 8.6 mg/dL — ABNORMAL LOW (ref 8.9–10.3)
Chloride: 94 mmol/L — ABNORMAL LOW (ref 98–111)
Creatinine, Ser: 0.48 mg/dL (ref 0.44–1.00)
GFR, Estimated: >60 mL/min (ref >60)
Glucose, Bld: 145 mg/dL — ABNORMAL HIGH (ref 70–99)
Potassium: 3.2 mmol/L — ABNORMAL LOW (ref 3.5–5.1)
Sodium: 135 mmol/L (ref 135–145)
Total Bilirubin: 3 mg/dL — ABNORMAL HIGH (ref 0.3–1.2)
Total Protein: 6 g/dL — ABNORMAL LOW (ref 6.5–8.1)

## 2020-09-21 LAB — CBC
HCT: 32.6 % — ABNORMAL LOW (ref 36.0–46.0)
Hemoglobin: 10.9 g/dL — ABNORMAL LOW (ref 12.0–15.0)
MCH: 33.7 pg (ref 26.0–34.0)
MCHC: 33.4 g/dL (ref 30.0–36.0)
MCV: 100.9 fL — ABNORMAL HIGH (ref 80.0–100.0)
Platelets: 99 10*3/uL — ABNORMAL LOW (ref 150–400)
RBC: 3.23 MIL/uL — ABNORMAL LOW (ref 3.87–5.11)
RDW: 13.5 % (ref 11.5–15.5)
WBC: 4.4 10*3/uL (ref 4.0–10.5)
nRBC: 0 % (ref 0.0–0.2)

## 2020-09-21 LAB — LIPASE, BLOOD: Lipase: 134 U/L — ABNORMAL HIGH (ref 11–51)

## 2020-09-21 MED ORDER — CHLORHEXIDINE GLUCONATE CLOTH 2 % EX PADS
6.0000 | MEDICATED_PAD | Freq: Every day | CUTANEOUS | Status: DC
  Administered 2020-09-21 (×2): 6 via TOPICAL

## 2020-09-21 MED ORDER — LORAZEPAM 1 MG PO TABS
1.0000 mg | ORAL_TABLET | ORAL | Status: DC | PRN
  Administered 2020-09-21: 1 mg via ORAL
  Administered 2020-09-23 (×2): 4 mg via ORAL
  Administered 2020-09-24: 3 mg via ORAL
  Filled 2020-09-21 (×2): qty 4
  Filled 2020-09-21: qty 3
  Filled 2020-09-21: qty 1

## 2020-09-21 MED ORDER — CHLORDIAZEPOXIDE HCL 25 MG PO CAPS
25.0000 mg | ORAL_CAPSULE | Freq: Four times a day (QID) | ORAL | Status: AC
  Administered 2020-09-21 – 2020-09-22 (×6): 25 mg via ORAL
  Filled 2020-09-21 (×6): qty 1

## 2020-09-21 MED ORDER — CHLORDIAZEPOXIDE HCL 5 MG PO CAPS
50.0000 mg | ORAL_CAPSULE | Freq: Once | ORAL | Status: AC
  Administered 2020-09-21: 50 mg via ORAL
  Filled 2020-09-21: qty 10

## 2020-09-21 MED ORDER — CHLORDIAZEPOXIDE HCL 5 MG PO CAPS: 25.0000 mg | ORAL_CAPSULE | Freq: Every day | ORAL | Status: DC

## 2020-09-21 MED ORDER — THIAMINE HCL 100 MG PO TABS
100.0000 mg | ORAL_TABLET | Freq: Every day | ORAL | Status: DC
  Administered 2020-09-21: 100 mg via ORAL
  Filled 2020-09-21: qty 1

## 2020-09-21 MED ORDER — THIAMINE HCL 100 MG/ML IJ SOLN
100.0000 mg | Freq: Every day | INTRAMUSCULAR | Status: DC
  Administered 2020-09-22 – 2020-09-23 (×2): 100 mg via INTRAVENOUS
  Filled 2020-09-21 (×2): qty 2

## 2020-09-21 MED ORDER — BOOST / RESOURCE BREEZE PO LIQD CUSTOM
1.0000 | Freq: Three times a day (TID) | ORAL | Status: DC
  Administered 2020-09-21 – 2020-09-27 (×16): 1 via ORAL

## 2020-09-21 MED ORDER — BISACODYL 10 MG RE SUPP
10.0000 mg | Freq: Once | RECTAL | Status: AC
  Administered 2020-09-21: 10 mg via RECTAL
  Filled 2020-09-21: qty 1

## 2020-09-21 MED ORDER — LOPERAMIDE HCL 2 MG PO CAPS
2.0000 mg | ORAL_CAPSULE | ORAL | Status: AC | PRN
Start: 2020-09-21 — End: 2020-09-24

## 2020-09-21 MED ORDER — CHLORDIAZEPOXIDE HCL 25 MG PO CAPS
25.0000 mg | ORAL_CAPSULE | Freq: Three times a day (TID) | ORAL | Status: AC
  Administered 2020-09-23 (×2): 25 mg via ORAL
  Filled 2020-09-21 (×3): qty 1

## 2020-09-21 MED ORDER — CHLORHEXIDINE GLUCONATE CLOTH 2 % EX PADS
6.0000 | MEDICATED_PAD | Freq: Every day | CUTANEOUS | Status: DC
  Administered 2020-09-23 – 2020-09-27 (×4): 6 via TOPICAL

## 2020-09-21 MED ORDER — ADULT MULTIVITAMIN W/MINERALS CH
1.0000 | ORAL_TABLET | Freq: Every day | ORAL | Status: DC
  Administered 2020-09-21 – 2020-09-28 (×7): 1 via ORAL
  Filled 2020-09-21 (×7): qty 1

## 2020-09-21 MED ORDER — CHLORDIAZEPOXIDE HCL 5 MG PO CAPS: 25.0000 mg | ORAL_CAPSULE | Freq: Four times a day (QID) | ORAL | Status: DC

## 2020-09-21 MED ORDER — FOLIC ACID 1 MG PO TABS
1.0000 mg | ORAL_TABLET | Freq: Every day | ORAL | Status: DC
  Administered 2020-09-21 – 2020-09-22 (×2): 1 mg via ORAL
  Filled 2020-09-21 (×2): qty 1

## 2020-09-21 MED ORDER — POTASSIUM CHLORIDE CRYS ER 20 MEQ PO TBCR
40.0000 meq | EXTENDED_RELEASE_TABLET | Freq: Once | ORAL | Status: AC
  Administered 2020-09-21: 40 meq via ORAL
  Filled 2020-09-21: qty 2

## 2020-09-21 MED ORDER — DEXMEDETOMIDINE HCL IN NACL 200 MCG/50ML IV SOLN
0.2000 ug/kg/h | INTRAVENOUS | Status: DC
  Administered 2020-09-21: 0.2 ug/kg/h via INTRAVENOUS
  Administered 2020-09-21: 0.6 ug/kg/h via INTRAVENOUS
  Administered 2020-09-22: 0.5 ug/kg/h via INTRAVENOUS
  Administered 2020-09-23: 0.2 ug/kg/h via INTRAVENOUS
  Administered 2020-09-23: 0.5 ug/kg/h via INTRAVENOUS
  Administered 2020-09-23: 0.6 ug/kg/h via INTRAVENOUS
  Filled 2020-09-21 (×5): qty 50

## 2020-09-21 MED ORDER — CHLORDIAZEPOXIDE HCL 25 MG PO CAPS: 25.0000 mg | ORAL_CAPSULE | Freq: Every day | ORAL | Status: DC

## 2020-09-21 MED ORDER — LORAZEPAM 2 MG/ML IJ SOLN
1.0000 mg | INTRAMUSCULAR | Status: DC | PRN
  Administered 2020-09-21: 4 mg via INTRAVENOUS
  Administered 2020-09-21: 3 mg via INTRAVENOUS
  Administered 2020-09-23: 4 mg via INTRAVENOUS
  Administered 2020-09-23: 3 mg via INTRAVENOUS
  Administered 2020-09-23: 4 mg via INTRAVENOUS
  Administered 2020-09-23: 2 mg via INTRAVENOUS
  Administered 2020-09-23 – 2020-09-24 (×4): 4 mg via INTRAVENOUS
  Administered 2020-09-24: 2 mg via INTRAVENOUS
  Administered 2020-09-24 (×2): 3 mg via INTRAVENOUS
  Filled 2020-09-21: qty 2
  Filled 2020-09-21: qty 1
  Filled 2020-09-21 (×6): qty 2
  Filled 2020-09-21: qty 1
  Filled 2020-09-21 (×4): qty 2
  Filled 2020-09-21: qty 1

## 2020-09-21 MED ORDER — CHLORDIAZEPOXIDE HCL 5 MG PO CAPS: 25.0000 mg | ORAL_CAPSULE | ORAL | Status: DC

## 2020-09-21 MED ORDER — CHLORDIAZEPOXIDE HCL 5 MG PO CAPS: 25.0000 mg | ORAL_CAPSULE | Freq: Three times a day (TID) | ORAL | Status: DC

## 2020-09-21 MED ORDER — CHLORDIAZEPOXIDE HCL 25 MG PO CAPS: 25.0000 mg | ORAL_CAPSULE | ORAL | Status: DC

## 2020-09-21 MED ORDER — HYDROXYZINE HCL 25 MG PO TABS: 25.0000 mg | ORAL_TABLET | Freq: Four times a day (QID) | ORAL | Status: AC | PRN

## 2020-09-21 NOTE — Progress Notes (Signed)
Subjective: Patient states that she has lower abdominal pain from constipation. Otherwise her main concern is lack of sleep and hallucinations. Patient states that she thought she was in a different facility yesterday, has had nightmares, is seeing doors on the ceiling and feels exhausted. She does not have any nausea or vomiting.  Objective: Vital signs in last 24 hours: Temp:  [98.1 F (36.7 C)-98.6 F (37 C)] 98.1 F (36.7 C) (07/17 0458) Pulse Rate:  [80-82] 82 (07/17 0458) Resp:  [16-18] 18 (07/17 0458) BP: (132-138)/(88-102) 136/88 (07/17 0458) SpO2:  [98 %-99 %] 98 % (07/17 0458) Weight change:  Last BM Date: 09/16/20  UJ:WJXBJYNPE:Appears comfortable GENERAL: Mild pallor, mild icterus  ABDOMEN: Soft, nondistended, nontender, normoactive bowel sounds EXTREMITIES: No deformity, no edema  Lab Results: Results for orders placed or performed during the hospital encounter of 09/19/20 (from the past 48 hour(s))  Resp Panel by RT-PCR (Flu A&B, Covid) Nasopharyngeal Swab     Status: None   Collection Time: 09/19/20 11:23 AM   Specimen: Nasopharyngeal Swab; Nasopharyngeal(NP) swabs in vial transport medium  Result Value Ref Range   SARS Coronavirus 2 by RT PCR NEGATIVE NEGATIVE    Comment: (NOTE) SARS-CoV-2 target nucleic acids are NOT DETECTED.  The SARS-CoV-2 RNA is generally detectable in upper respiratory specimens during the acute phase of infection. The lowest concentration of SARS-CoV-2 viral copies this assay can detect is 138 copies/mL. A negative result does not preclude SARS-Cov-2 infection and should not be used as the sole basis for treatment or other patient management decisions. A negative result may occur with  improper specimen collection/handling, submission of specimen other than nasopharyngeal swab, presence of viral mutation(s) within the areas targeted by this assay, and inadequate number of viral copies(<138 copies/mL). A negative result must be combined  with clinical observations, patient history, and epidemiological information. The expected result is Negative.  Fact Sheet for Patients:  BloggerCourse.comhttps://www.fda.gov/media/152166/download  Fact Sheet for Healthcare Providers:  SeriousBroker.ithttps://www.fda.gov/media/152162/download  This test is no t yet approved or cleared by the [place] FDA and  has been authorized for detection and/or diagnosis of SARS-CoV-2 by FDA under an Emergency Use Authorization (EUA). This EUA will remain  in effect (meaning this test can be used) for the duration of the COVID-19 declaration under Section 564(b)(1) of the Act, 21 U.S.C.section 360bbb-3(b)(1), unless the authorization is terminated  or revoked sooner.       Influenza A by PCR NEGATIVE NEGATIVE   Influenza B by PCR NEGATIVE NEGATIVE    Comment: (NOTE) The Xpert Xpress SARS-CoV-2/FLU/RSV plus assay is intended as an aid in the diagnosis of influenza from Nasopharyngeal swab specimens and should not be used as a sole basis for treatment. Nasal washings and aspirates are unacceptable for Xpert Xpress SARS-CoV-2/FLU/RSV testing.  Fact Sheet for Patients: BloggerCourse.comhttps://www.fda.gov/media/152166/download  Fact Sheet for Healthcare Providers: SeriousBroker.ithttps://www.fda.gov/media/152162/download  This test is not yet approved or cleared by the [place] FDA and has been authorized for detection and/or diagnosis of SARS-CoV-2 by FDA under an Emergency Use Authorization (EUA). This EUA will remain in effect (meaning this test can be used) for the duration of the COVID-19 declaration under Section 564(b)(1) of the Act, 21 U.S.C. section 360bbb-3(b)(1), unless the authorization is terminated or revoked.  Performed at [facility], [address], [place], [place] [address]   Hemoglobin A1c     Status: Abnormal   Collection Time: 09/19/20 11:49 AM  Result Value Ref Range   Hgb A1c MFr Bld 5.7 (H)  4.8 - 5.6 %    Comment: (NOTE) Pre diabetes:           5.7%-6.4%  Diabetes:              >6.4%  Glycemic control for   <7.0% adults with diabetes    Mean Plasma Glucose 116.89 mg/dL    Comment: Performed at [facility], [address], [place], [place] [address]  Urinalysis, Routine w reflex microscopic Urine, Clean Catch     Status: Abnormal   Collection Time: 09/19/20 12:02 PM  Result Value Ref Range   Color, Urine YELLOW YELLOW   APPearance CLEAR CLEAR   Specific Gravity, Urine >1.046 (H) 1.005 - 1.030   pH 6.0 5.0 - 8.0   Glucose, UA 100 (A) NEGATIVE mg/dL   Hgb urine dipstick NEGATIVE NEGATIVE   Bilirubin Urine NEGATIVE NEGATIVE   Ketones, ur >80 (A) NEGATIVE mg/dL   Protein, ur 30 (A) NEGATIVE mg/dL   Nitrite NEGATIVE NEGATIVE   Leukocytes,Ua NEGATIVE NEGATIVE   RBC / HPF 0-5 0 - 5 RBC/hpf   WBC, UA 0-5 0 - 5 WBC/hpf   Mucus PRESENT    Hyaline Casts, UA PRESENT     Comment: Performed at [facility], [address] [place], [place] [address]  HIV Antibody (routine testing w rflx)     Status: None   Collection Time: 09/19/20  7:34 PM  Result Value Ref Range   HIV Screen 4th Generation wRfx Non Reactive Non Reactive    Comment: Performed at [facility], [address], [place], [place] [address]  CBC     Status: Abnormal   Collection Time: 09/19/20  7:34 PM  Result Value Ref Range   WBC 3.1 (L) 4.0 - 10.5 K/uL   RBC 3.65 (L) 3.87 - 5.11 MIL/uL   Hemoglobin 12.3 12.0 - 15.0 g/dL   HCT 21.7 47.1 - 59.5 %   MCV 100.3 (H) 80.0 - 100.0 fL   MCH 33.7 26.0 - 34.0 pg   MCHC 33.6 30.0 - 36.0 g/dL   RDW 39.6 72.8 - 97.9 %   Platelets 122 (L) 150 - 400 K/uL    Comment: Immature Platelet Fraction may be clinically indicated, consider ordering this additional test [id number]    nRBC 0.0 0.0 - 0.2 %    Comment: Performed at [facility], [address] [place], [place] [address]  Creatinine, serum     Status: None   Collection Time: 09/19/20  7:34 PM  Result Value Ref  Range   Creatinine, Ser 0.67 0.44 - 1.00 mg/dL   GFR, Estimated >93 >96 mL/min    Comment: (NOTE) Calculated using the CKD-EPI Creatinine Equation (2021) Performed at [facility], [address], [place], [place] [address]   CBC     Status: Abnormal   Collection Time: 09/20/20  4:43 AM  Result Value Ref Range   WBC 3.8 (L) 4.0 - 10.5 K/uL   RBC 3.54 (L) 3.87 - 5.11 MIL/uL   Hemoglobin 11.8 (L) 12.0 - 15.0 g/dL   HCT 47.2 07.2 - 18.2 %   MCV 102.3 (H) 80.0 - 100.0 fL   MCH 33.3 26.0 - 34.0 pg   MCHC 32.6 30.0 - 36.0 g/dL   RDW 88.3 37.4 - 45.1 %   Platelets 113 (L) 150 - 400 K/uL    Comment: Immature Platelet Fraction may be clinically indicated, consider ordering this additional test [id number] PLATELET COUNT CONFIRMED BY  SMEAR    nRBC 0.0 0.0 - 0.2 %    Comment: Performed at [facility], [address] [place], [place] [address]  Comprehensive metabolic panel     Status: Abnormal   Collection Time: 09/20/20  4:43 AM  Result Value Ref Range   Sodium 137 135 - 145 mmol/L   Potassium 3.8 3.5 - 5.1 mmol/L   Chloride 98 98 - 111 mmol/L   CO2 27 22 - 32 mmol/L   Glucose, Bld 135 (H) 70 - 99 mg/dL    Comment: Glucose reference range applies only to samples taken after fasting for at least 8 hours.   BUN 8 6 - 20 mg/dL   Creatinine, Ser 5.17 0.44 - 1.00 mg/dL   Calcium 8.8 (L) 8.9 - 10.3 mg/dL   Total Protein 6.8 6.5 - 8.1 g/dL   Albumin 4.1 3.5 - 5.0 g/dL   AST 70 (H) 15 - 41 U/L   ALT 88 (H) 0 - 44 U/L   Alkaline Phosphatase 73 38 - 126 U/L   Total Bilirubin 3.3 (H) 0.3 - 1.2 mg/dL   GFR, Estimated >61 >60 mL/min    Comment: (NOTE) Calculated using the CKD-EPI Creatinine Equation (2021)    Anion gap 12 5 - 15    Comment: Performed at [facility], [address] [place], [place] [address]  Lipase, blood     Status: Abnormal   Collection Time: 09/20/20  4:43 AM  Result Value Ref Range   Lipase 431 (H) 11 - 51  U/L    Comment: RESULTS CONFIRMED BY MANUAL DILUTION Performed at [facility], [address], [place], [place] [address]   Comprehensive metabolic panel     Status: Abnormal   Collection Time: 09/21/20  5:39 AM  Result Value Ref Range   Sodium 135 135 - 145 mmol/L   Potassium 3.2 (L) 3.5 - 5.1 mmol/L   Chloride 94 (L) 98 - 111 mmol/L   CO2 28 22 - 32 mmol/L   Glucose, Bld 145 (H) 70 - 99 mg/dL    Comment: Glucose reference range applies only to samples taken after fasting for at least 8 hours.   BUN <5 (L) 6 - 20 mg/dL   Creatinine, Ser 8.54 0.44 - 1.00 mg/dL   Calcium 8.6 (L) 8.9 - 10.3 mg/dL   Total Protein 6.0 (L) 6.5 - 8.1 g/dL   Albumin 3.5 3.5 - 5.0 g/dL   AST 52 (H) 15 - 41 U/L   ALT 60 (H) 0 - 44 U/L   Alkaline Phosphatase 62 38 - 126 U/L   Total Bilirubin 3.0 (H) 0.3 - 1.2 mg/dL   GFR, Estimated >62 >70 mL/min    Comment: (NOTE) Calculated using the CKD-EPI Creatinine Equation (2021)    Anion gap 13 5 - 15    Comment: Performed at [facility], [address], [place], [place] [address]  CBC     Status: Abnormal   Collection Time: 09/21/20  5:39 AM  Result Value Ref Range   WBC 4.4 4.0 - 10.5 K/uL   RBC 3.23 (L) 3.87 - 5.11 MIL/uL   Hemoglobin 10.9 (L) 12.0 - 15.0 g/dL   HCT 38.1 (L) 82.9 - 93.7 %   MCV 100.9 (H) 80.0 - 100.0 fL   MCH 33.7 26.0 - 34.0 pg   MCHC 33.4 30.0 - 36.0 g/dL   RDW 16.9 67.8 - 93.8 %   Platelets 99 (L) 150 -  400 K/uL    Comment: Immature Platelet Fraction may be clinically indicated, consider ordering this additional test [id number] CONSISTENT WITH PREVIOUS RESULT    nRBC 0.0 0.0 - 0.2 %    Comment: Performed at [facility], [address] [place], [place] [address]  Lipase, blood     Status: Abnormal   Collection Time: 09/21/20  5:39 AM  Result Value Ref Range   Lipase 134 (H) 11 - 51 U/L    Comment: Performed at [facility], [address],  [place], [place] [address]    Studies/Results: CT ABDOMEN PELVIS W CONTRAST  Result Date: 09/19/2020 CLINICAL DATA:  Abdominal pain.  Pancreatitis suspected EXAM: CT ABDOMEN AND PELVIS WITH CONTRAST TECHNIQUE: Multidetector CT imaging of the abdomen and pelvis was performed using the standard protocol following bolus administration of intravenous contrast. CONTRAST:  OMNIPAQUE IOHEXOL 300 MG/ML  SOLN COMPARISON:  01/20/2014 FINDINGS: Lower chest: Lung bases are clear. No effusions. Heart is normal size. Hepatobiliary: Severe diffuse fatty infiltration throughout the liver. No focal abnormality. Gallbladder unremarkable. Pancreas: Inflammation/stranding around the pancreas and continuing in the anterior pararenal spaces, left greater than right compatible with acute pancreatitis. No ductal dilatation. No evidence of pancreatic necrosis. Spleen: No focal abnormality.  Normal size. Adrenals/Urinary Tract: No adrenal abnormality. No focal renal abnormality. No stones or hydronephrosis. Urinary bladder is unremarkable. Stomach/Bowel: Left colonic diverticulosis. No active diverticulitis. Stomach and small bowel unremarkable. Vascular/Lymphatic: No evidence of aneurysm or adenopathy. Reproductive: Prior hysterectomy.  No adnexal masses. Other: No free fluid or free air. Musculoskeletal: No acute bony abnormality. IMPRESSION: Stranding around the pancreas compatible with acute pancreatitis. Severe diffuse hepatic steatosis. Left colonic diverticulosis. Electronically Signed   By: Charlett Nose M.D.   On: 09/19/2020 11:12    Medications: I have reviewed the patient's current medications.  Assessment: Pancreatitis-likely related to alcohol use(based on current symptoms which is possibly related to alcohol withdrawal) Severe hepatic steatosis Macrocytic anemia Hypokalemia Thrombocytopenia, platelet 99 today T bili 3, AST/ALT ratio elevated at 52/60, normal ALP  Plan: Initially etiology of pancreatitis was  not clear, however clinical presentation with hallucinations, paranoid behavior possibly points towards alcohol related pancreatitis. Concerns for alcohol withdrawal related symptoms-patient has been started on folic acid, multivitamin and thiamine and on Ativan protocol.  Clinically doing well with pancreatitis, diet has been advanced by primary team to full liquids.  Will give Dulcolax suppository 1 dose today, if needed can be continued on a daily basis for ongoing constipation.  Monitor for encephalopathy, if patient appears more confused, recommend PT/INR to evaluate for coagulopathy, alcoholic hepatitis.  Kerin Salen, MD 09/21/2020, 11:06 AM

## 2020-09-21 NOTE — Progress Notes (Signed)
Initial Nutrition Assessment  DOCUMENTATION CODES:  Not applicable  INTERVENTION:  Continue current diet as ordered, advanced to full liquids as tolerated Boost Breeze po TID, each supplement provides 250 kcal and 9 grams of protein Recommend adding scheduled bowel regimen as pt reports constipation MVI with minerals daily  NUTRITION DIAGNOSIS:  Unintentional weight loss related to inability to eat, nausea as evidenced by per patient/family report, percent weight loss (11.9% x 3 months).  GOAL:  Patient will meet greater than or equal to 90% of their needs  MONITOR:  PO intake, Supplement acceptance, Labs  REASON FOR ASSESSMENT:  Malnutrition Screening Tool    ASSESSMENT:  50 y.o. female presented to ED with multiple days of dry heaving, shaking/tremors, and abdominal pain. Pt reports GI symptoms have been ongoing for >6 months, but pain started acutely over the last few days. PMH relevant for HTN  In ED, pt found to have elevated lipase, abnormal CT scan showing pancreatitis, diffuse hepatic steatosis, and diverticulosis.    Patient states she was a heavy alcohol user until 5 years ago, with 4-5 servings daily. Has cut back to 2-3 times a week. Per pt, last alcohol use was last week.  Noted weight loss of 11.9% over the last 3 months (4/29-7/15) which is severe. Pt reports usual weight of about 155 lbs.   Called pt on room phone to discuss recent nutrition hx. Pt reports that she did not eat yesterday and is currently working on a clear liquid breakfast tray. States she is not having any worse pain with eating this AM and no nausea. Pt does report that she is constipated and it is making her feel quite uncomfortable. Last BM noted to be 5 days ago. Will mention to MD and recommend a scheduled bowel regimen.   Prior to admission pt reports that appetite had been poor, only able to eat small amounts at a time. Based on poor intake and weight loss, pt likely has a degree of  malnutrition. Will follow-up with physical exam. Pt is open to nutrition supplements while admitted to ensure adequate nutrition intake.   Average Meal Intake: 7/16-7/17: 100% intake x 1 recorded meal  Nutritionally Relevant Medications: Continuous Infusions:  lactated ringers 150 mL/hr at 09/21/20    PRN Meds: ondansetron, promethazine  Labs Reviewed: K 3.2 Lipase 134 (trending down) AST 52 (trending down) ALT 60 (trending down) HgbA1c 5.7% (7/15)  NUTRITION - FOCUSED PHYSICAL EXAM: Defer to in-person assessment  Diet Order:   Diet Order             Diet clear liquid Room service appropriate? Yes; Fluid consistency: Thin  Diet effective now                   EDUCATION NEEDS:  No education needs have been identified at this time  Skin:  Skin Assessment: Reviewed RN Assessment  Last BM:  7/12 per RN documentation  Height:  Ht Readings from Last 1 Encounters:  09/19/20 5\' 4"  (1.626 m)    Weight:  Wt Readings from Last 1 Encounters:  09/19/20 61.2 kg    Ideal Body Weight:  54.5 kg  BMI:  Body mass index is 23.17 kg/m.  Estimated Nutritional Needs:  Kcal:  1600-1800 kcal/d Protein:  80-100g/d Fluid:  >1.8L/d  09/21/20, RD, LDN Clinical Dietitian Pager on Amion

## 2020-09-21 NOTE — Progress Notes (Signed)
eLink Physician-Brief Progress Note Patient Name: Hannah Farmer DOB: [birth year] MRN: [id number]   Date of Service  09/21/2020  HPI/Events of Note  ETOH withdrawal - Not responsive to Ativan via CIWA dosing.   eICU Interventions  Plan: Precedex IV infusion. Titrate to RASS  = 0.      Intervention Category Major Interventions: Delirium, psychosis, severe agitation - evaluation and management  [name] 09/21/2020, 7:27 PM

## 2020-09-21 NOTE — Progress Notes (Signed)
RN Aware

## 2020-09-21 NOTE — Consult Note (Signed)
NAME:  Hannah Farmer, MRN:  [id number], DOB:  12-May-1970, LOS: 2 ADMISSION DATE:  09/19/2020, CONSULTATION DATE: 09/21/2020 REFERRING MD: Dr. Jonathon Bellows, CHIEF COMPLAINT: Pancreatitis alcohol withdrawal hallucinosis  History of Present Illness:  Patient is a 50 year old female with a heavy history of alcohol use admitted 09/19/2020 with abdominal pain anion gap acidosis elevated bilirubin slightly elevated AST ALT, hyperglycemia and a lipase of 1487.  Ultrasound of the abdomen on July 7 was unremarkable.  CT scan of the abdomen at time of admission showed evidence of acute pancreatitis.  He did have significant epigastric tenderness at time of admission.  Gastroenterology saw the patient earlier today noted her insomnia hallucinations but overall felt that she was having significant improvement without worsening nausea vomiting although they did note thrombocytopenia elevated AST ALT ratio and elevated bilirubin.  Lipase this morning was down to 134. With the patient's increased insomnia and hallucination concern has been raised for worsening withdrawal despite being on Ativan protocol.  Pertinent  Medical History    Acute pancreatitis 09/19/2020   Abnormal LFTs 08/03/2019   Hypercholesteremia 07/03/2019   Hot flashes 05/31/2019   Hypertension 05/31/2019    Significant Hospital Events: Including procedures, antibiotic start and stop dates in addition to other pertinent events   Admission to the hospital 09/19/2020  Interim History / Subjective:  NA  Objective   Blood pressure (!) 137/91, pulse 100, temperature 98.3 F (36.8 C), temperature source Oral, resp. rate (!) 21, height 5\' 4"  (1.626 m), weight 61.2 kg, SpO2 98 %.        Intake/Output Summary (Last 24 hours) at 09/21/2020 1915 Last data filed at 09/21/2020 1030 Gross per 24 hour  Intake 240 ml  Output --  Net 240 ml   Filed Weights   09/19/20 0920  Weight: 61.2 kg    Examination: General: well developed wf, confused HENT:  wnl Lungs: clear Cardiovascular: reg Abdomen: benign Extremities: wnl Neuro: delirious, non focal GU: na  Resolved Hospital Problem list   Acute pancreatitis: Clinically improved  Assessment & Plan:  1.  Alcohol withdrawal hallucinosis/DTs: We will transfer patient to the ICU and initiate Precedex to improve overall comfort while continuing as needed Ativan as ordered  2.  Acute pancreatitis: We will continue to monitor, continue current therapy.  3.  Alcohol abuse: Discharge planning in regards to alcohol cessation possible rehabilitation should be considered  4.  Thrombocytopenia: Continue to monitor  5.  Transaminitis with hyperbilirubinemia: Continue to monitor    Best Practice (right click and "Reselect all SmartList Selections" daily)   Diet/type: Regular consistency (see orders) DVT prophylaxis:  GI prophylaxis: N/A Lines: N/A Foley:  N/A Code Status:  full code Last date of multidisciplinary goals of care discussion [family not available]  Labs   CBC: Recent Labs  Lab 09/19/20 0930 09/19/20 1934 09/20/20 0443 09/21/20 0539  WBC 4.6 3.1* 3.8* 4.4  NEUTROABS 3.9  --   --   --   HGB 13.4 12.3 11.8* 10.9*  HCT 40.1 36.6 36.2 32.6*  MCV 99.0 100.3* 102.3* 100.9*  PLT 147* 122* 113* 99*    Basic Metabolic Panel: Recent Labs  Lab 09/19/20 0930 09/19/20 1934 09/20/20 0443 09/21/20 0539  NA 134*  --  137 135  K 3.6  --  3.8 3.2*  CL 88*  --  98 94*  CO2 13*  --  27 28  GLUCOSE 229*  --  135* 145*  BUN 10  --  8 <5*  CREATININE 0.91  0.67 0.52 0.48  CALCIUM 9.4  --  8.8* 8.6*   GFR: Estimated Creatinine Clearance: 72.6 mL/min (by C-G formula based on SCr of 0.48 mg/dL). Recent Labs  Lab 09/19/20 0930 09/19/20 1934 09/20/20 0443 09/21/20 0539  WBC 4.6 3.1* 3.8* 4.4    Liver Function Tests: Recent Labs  Lab 09/19/20 0930 09/20/20 0443 09/21/20 0539  AST 137* 70* 52*  ALT 138* 88* 60*  ALKPHOS 100 73 62  BILITOT 3.5* 3.3* 3.0*  PROT 7.7  6.8 6.0*  ALBUMIN 5.1* 4.1 3.5   Recent Labs  Lab 09/19/20 0930 09/20/20 0443 09/21/20 0539  LIPASE 1,487* 431* 134*   No results for input(s): AMMONIA in the last 168 hours.  ABG No results found for: PHART, PCO2ART, PO2ART, HCO3, TCO2, ACIDBASEDEF, O2SAT   Coagulation Profile: No results for input(s): INR, PROTIME in the last 168 hours.  Cardiac Enzymes: No results for input(s): CKTOTAL, CKMB, CKMBINDEX, TROPONINI in the last 168 hours.  HbA1C: Hgb A1c MFr Bld  Date/Time Value Ref Range Status  09/19/2020 11:49 AM 5.7 (H) 4.8 - 5.6 % Final    Comment:    (NOTE) Pre diabetes:          5.7%-6.4%  Diabetes:              >6.4%  Glycemic control for   <7.0% adults with diabetes   05/31/2019 09:19 AM 5.5 4.8 - 5.6 % Final    Comment:             Prediabetes: 5.7 - 6.4          Diabetes: >6.4          Glycemic control for adults with diabetes: <7.0     CBG: No results for input(s): GLUCAP in the last 168 hours.  Review of Systems:   Unable to obtain due to delirium  Past Medical History:  She,  has a past medical history of Allergy, Hypertension, and Ileus, postoperative (HCC) (01/25/2014).   Surgical History:   Past Surgical History:  Procedure Laterality Date   ABDOMINAL HYSTERECTOMY     CESAREAN SECTION     x 2   LAPAROSCOPIC APPENDECTOMY N/A 01/20/2014   Procedure: APPENDECTOMY LAPAROSCOPIC;  Surgeon: Emelia Loron, MD;  Location: [facility];  Service: General;  Laterality: N/A;     Social History:   reports that she has never smoked. She has never used smokeless tobacco. She reports current alcohol use of about 6.0 standard drinks of alcohol per week. She reports that she does not use drugs.   Family History:  Her family history includes Cancer in her father; Heart disease in her father; Hypertension in her father; Macular degeneration in her mother; Stroke in her father.   Allergies Allergies  Allergen Reactions   Codeine Rash     Home  Medications  Prior to Admission medications   Medication Sig Start Date End Date Taking? Authorizing Provider  Brimonidine Tartrate (LUMIFY) 0.025 % SOLN Place 1 drop into both eyes daily.   Yes [provider]  lisinopril-hydrochlorothiazide (ZESTORETIC) 20-12.5 MG tablet TAKE 1 TABLET BY MOUTH EVERY DAY Patient taking differently: Take 1 tablet by mouth daily. 11/05/19  Yes Anyanwu, Jethro Bastos, MD  ondansetron (ZOFRAN-ODT) 4 MG disintegrating tablet Take 4 mg by mouth every 8 (eight) hours as needed for nausea or vomiting.   Yes [provider]     Critical care time: Over 45 minutes was spent in chart review patient evaluation and critical care planning.

## 2020-09-21 NOTE — Progress Notes (Addendum)
PROGRESS NOTE    Hannah Farmer  TGP:[id number] DOB: 03-Jun-1970 DOA: 09/19/2020 PCP: Arvilla Market, MD   Chief Complaint  Patient presents with   Abdominal Pain   Brief Narrative: 50 year old female with history of suspected multiple sclerosis with work-up ongoing, nondyspneic tremor, hypertension, history of appendicectomy with postop ileus who has been having significant GI symptoms usually preceded by some unexplained tremors and undergoing outpatient work-up.  Had ultrasound of abdomen July 7 that did not show any significant finding and was referred to GI.  But patient came to the ED and drop raise with epigastric tenderness LFTs are elevated lipase with abnormal 1500, CT scan showed evidence of acute pancreatitis- "Stranding around the pancreas compatible with acute pancreatitis.Severe diffuse hepatic steatosis.Left colonic diverticulosis." Patient was admitted  Subjective:  Overnight ripped iv was hallucinating, paranoid.  This am mildly shaky, sweaty, Husband at bedside- he came out to tell me that he thinks she drinks etoh still at home, and having withdrawal, and patient not forthcoming. Pt reports she drank last Tuesday, but " not much". No nausea vomiting, abdomen pain better  Assessment & Plan:  Acute pancreatitis Unclear etiology. Suspect from Etoh use. ?meds related - on hctz. No evidence of gallstone, no history of alcohol abuse recently- although was heavy drinker 5 yrs ago and NOW occasionally drinks- a glass of wine 2 time /wk.  TG level normal at 156.  LFTs/lipase trending down,  Igg4G pending. Cont ivf RK, pain meds w/ iv dilaudid prn,  CLD, adat. Appreciate GI inputs.   Transaminitis Severe diffuse hepatic steatosis: Symptomatic management , trending dowN. LFTS, needs OP FU w/ GI.   Etoh use w/ withdrawal/hallucination during night- per husband she drinks, and he thinks patient is not forthcoming. Add ciwa ativan, thiamine  MV.  Constipation:dulcolax suppository.  Hypertension: BP is controlled, On HCTZ lisinopril at home and currently on hold  Hypokalemia- will replete  Hypercholesteremia: Not on a statin  Thrombocytopenia:?  Etiology.  Monitor Recent Labs  Lab 09/19/20 0930 09/19/20 1934 09/20/20 0443 09/21/20 0539  PLT 147* 122* 113* 99*   ADDENDUM  Patient started to have worsening CIWA score, hallucination, agitated- came and saw her agai nat bedside. Librium taper ordered, on ciwa ativan. Will transfer to STEPDOWN, restraint b/l wrist ordered. May need Precedex drip. Cont to monitor closely.  Husband updated.  Diet Order             Diet clear liquid Room service appropriate? Yes; Fluid consistency: Thin  Diet effective now                  Patient's Body mass index is 23.17 kg/m.  DVT prophylaxis: enoxaparin (LOVENOX) injection 40 mg Start: 09/19/20 2200 Code Status:   Code Status: Full Code  Family Communication: plan of care discussed with patient and her husband at bedside. Status is: Inpatient  Remains inpatient appropriate because:IV treatments appropriate due to intensity of illness or inability to take PO and Inpatient level of care appropriate due to severity of illness Dispo: The patient is from: Home              Anticipated d/c is to: Home              Patient currently is not medically stable to d/c.   Difficult to place patient No Unresulted Labs (From admission, onward)     Start     Ordered   09/26/20 0500  Creatinine, serum  (enoxaparin (LOVENOX)  CrCl >/= 30 ml/min)  Weekly,   R     Comments: while on enoxaparin therapy    09/19/20 1849   09/21/20 0500  Comprehensive metabolic panel  Daily,   R      09/20/20 1004   09/21/20 0500  CBC  Daily,   R      09/20/20 1004   09/20/20 0705  IgG 4  Add-on,   AD        09/20/20 0704   09/19/20 0939  H Pylori, IGM, IGG, IGA AB  Once,   R        09/19/20 5102          Medications reviewed:  Scheduled  Meds:  brimonidine  1 drop Both Eyes Daily   enoxaparin (LOVENOX) injection  40 mg Subcutaneous Q24H    HYDROmorphone (DILAUDID) injection  1 mg Intravenous Once   Continuous Infusions:  lactated ringers 150 mL/hr at 09/21/20 0051   promethazine (PHENERGAN) injection (IM or IVPB) Stopped (09/20/20 0005)   Consultants:see note  Procedures:see note Antimicrobials: Anti-infectives (From admission, onward)    None      Culture/Microbiology No results found for: SDES, SPECREQUEST, CULT, REPTSTATUS  Other culture-see note  Objective: Vitals: Today's Vitals   09/20/20 2130 09/20/20 2146 09/20/20 2216 09/21/20 0458  BP: (!) 138/102   136/88  Pulse: 81   82  Resp: 18   18  Temp: 98.6 F (37 C)   98.1 F (36.7 C)  TempSrc: Oral     SpO2: 99%   98%  Weight:      Height:      PainSc:  8  0-No pain     Intake/Output Summary (Last 24 hours) at 09/21/2020 0729 Last data filed at 09/20/2020 1300 Gross per 24 hour  Intake 1080 ml  Output --  Net 1080 ml    Filed Weights   09/19/20 0920  Weight: 61.2 kg   Weight change:   Intake/Output from previous day: 07/16 0701 - 07/17 0700 In: 1080 [P.O.:1080] Out: -  Intake/Output this shift: No intake/output data recorded. Filed Weights   09/19/20 0920  Weight: 61.2 kg   Examination: General exam: AAOx 3, sweaty HEENT:Oral mucosa moist, Ear/Nose WNL grossly, dentition normal. Respiratory system: bilaterally clear, no use of accessory muscle Cardiovascular system: S1 & S2 +, No JVD,. Gastrointestinal system: Abdomen soft,NT,ND, BS+ Nervous System:Alert, awake, moving extremities and grossly nonfocal Extremities: no edema, distal peripheral pulses palpable.  Skin: No rashes,no icterus. MSK: Normal muscle bulk,tone, power  Data Reviewed: I have personally reviewed following labs and imaging studies CBC: Recent Labs  Lab 09/19/20 0930 09/19/20 1934 09/20/20 0443 09/21/20 0539  WBC 4.6 3.1* 3.8* 4.4  NEUTROABS 3.9  --    --   --   HGB 13.4 12.3 11.8* 10.9*  HCT 40.1 36.6 36.2 32.6*  MCV 99.0 100.3* 102.3* 100.9*  PLT 147* 122* 113* 99*   Basic Metabolic Panel: Recent Labs  Lab 09/19/20 0930 09/19/20 1934 09/20/20 0443 09/21/20 0539  NA 134*  --  137 135  K 3.6  --  3.8 3.2*  CL 88*  --  98 94*  CO2 13*  --  27 28  GLUCOSE 229*  --  135* 145*  BUN 10  --  8 <5*  CREATININE 0.91 0.67 0.52 0.48  CALCIUM 9.4  --  8.8* 8.6*    GFR: Estimated Creatinine Clearance: 72.6 mL/min (by C-G formula based on SCr of 0.48 mg/dL). Liver Function Tests:  Recent Labs  Lab 09/19/20 0930 09/20/20 0443 09/21/20 0539  AST 137* 70* 52*  ALT 138* 88* 60*  ALKPHOS 100 73 62  BILITOT 3.5* 3.3* 3.0*  PROT 7.7 6.8 6.0*  ALBUMIN 5.1* 4.1 3.5    Recent Labs  Lab 09/19/20 0930 09/20/20 0443 09/21/20 0539  LIPASE 1,487* 431* 134*    No results for input(s): AMMONIA in the last 168 hours. Coagulation Profile: No results for input(s): INR, PROTIME in the last 168 hours. Cardiac Enzymes: No results for input(s): CKTOTAL, CKMB, CKMBINDEX, TROPONINI in the last 168 hours. BNP (last 3 results) No results for input(s): PROBNP in the last 8760 hours. HbA1C: Recent Labs    09/19/20 1149  HGBA1C 5.7*    CBG: No results for input(s): GLUCAP in the last 168 hours. Lipid Profile: Recent Labs    09/19/20 0930  TRIG 156*    Thyroid Function Tests: No results for input(s): TSH, T4TOTAL, FREET4, T3FREE, THYROIDAB in the last 72 hours. Anemia Panel: No results for input(s): VITAMINB12, FOLATE, FERRITIN, TIBC, IRON, RETICCTPCT in the last 72 hours. Sepsis Labs: No results for input(s): PROCALCITON, LATICACIDVEN in the last 168 hours.  Recent Results (from the past 240 hour(s))  Resp Panel by RT-PCR (Flu A&B, Covid) Nasopharyngeal Swab     Status: None   Collection Time: 09/19/20 11:23 AM   Specimen: Nasopharyngeal Swab; Nasopharyngeal(NP) swabs in vial transport medium  Result Value Ref Range Status    SARS Coronavirus 2 by RT PCR NEGATIVE NEGATIVE Final    Comment: (NOTE) SARS-CoV-2 target nucleic acids are NOT DETECTED.  The SARS-CoV-2 RNA is generally detectable in upper respiratory specimens during the acute phase of infection. The lowest concentration of SARS-CoV-2 viral copies this assay can detect is 138 copies/mL. A negative result does not preclude SARS-Cov-2 infection and should not be used as the sole basis for treatment or other patient management decisions. A negative result may occur with  improper specimen collection/handling, submission of specimen other than nasopharyngeal swab, presence of viral mutation(s) within the areas targeted by this assay, and inadequate number of viral copies(<138 copies/mL). A negative result must be combined with clinical observations, patient history, and epidemiological information. The expected result is Negative.  Fact Sheet for Patients:  BloggerCourse.comhttps://www.fda.gov/media/152166/download  Fact Sheet for Healthcare Providers:  SeriousBroker.ithttps://www.fda.gov/media/152162/download  This test is no t yet approved or cleared by the [place] FDA and  has been authorized for detection and/or diagnosis of SARS-CoV-2 by FDA under an Emergency Use Authorization (EUA). This EUA will remain  in effect (meaning this test can be used) for the duration of the COVID-19 declaration under Section 564(b)(1) of the Act, 21 U.S.C.section 360bbb-3(b)(1), unless the authorization is terminated  or revoked sooner.       Influenza A by PCR NEGATIVE NEGATIVE Final   Influenza B by PCR NEGATIVE NEGATIVE Final    Comment: (NOTE) The Xpert Xpress SARS-CoV-2/FLU/RSV plus assay is intended as an aid in the diagnosis of influenza from Nasopharyngeal swab specimens and should not be used as a sole basis for treatment. Nasal washings and aspirates are unacceptable for Xpert Xpress SARS-CoV-2/FLU/RSV testing.  Fact Sheet for  Patients: BloggerCourse.comhttps://www.fda.gov/media/152166/download  Fact Sheet for Healthcare Providers: SeriousBroker.ithttps://www.fda.gov/media/152162/download  This test is not yet approved or cleared by the [place] FDA and has been authorized for detection and/or diagnosis of SARS-CoV-2 by FDA under an Emergency Use Authorization (EUA). This EUA will remain in effect (meaning this test can be used) for the duration of the  COVID-19 declaration under Section 564(b)(1) of the Act, 21 U.S.C. section 360bbb-3(b)(1), unless the authorization is terminated or revoked.  Performed at [facility], [address], [place], [place] [address]    Radiology Studies: CT ABDOMEN PELVIS W CONTRAST  Result Date: 09/19/2020 CLINICAL DATA:  Abdominal pain.  Pancreatitis suspected EXAM: CT ABDOMEN AND PELVIS WITH CONTRAST TECHNIQUE: Multidetector CT imaging of the abdomen and pelvis was performed using the standard protocol following bolus administration of intravenous contrast. CONTRAST:  OMNIPAQUE IOHEXOL 300 MG/ML  SOLN COMPARISON:  01/20/2014 FINDINGS: Lower chest: Lung bases are clear. No effusions. Heart is normal size. Hepatobiliary: Severe diffuse fatty infiltration throughout the liver. No focal abnormality. Gallbladder unremarkable. Pancreas: Inflammation/stranding around the pancreas and continuing in the anterior pararenal spaces, left greater than right compatible with acute pancreatitis. No ductal dilatation. No evidence of pancreatic necrosis. Spleen: No focal abnormality.  Normal size. Adrenals/Urinary Tract: No adrenal abnormality. No focal renal abnormality. No stones or hydronephrosis. Urinary bladder is unremarkable. Stomach/Bowel: Left colonic diverticulosis. No active diverticulitis. Stomach and small bowel unremarkable. Vascular/Lymphatic: No evidence of aneurysm or adenopathy. Reproductive: Prior hysterectomy.  No adnexal masses. Other: No free fluid or free air. Musculoskeletal: No  acute bony abnormality. IMPRESSION: Stranding around the pancreas compatible with acute pancreatitis. Severe diffuse hepatic steatosis. Left colonic diverticulosis. Electronically Signed   By: Charlett Nose M.D.   On: 09/19/2020 11:12     LOS: 2 days   Lanae Boast, MD [facility]  09/21/2020, 7:29 AM

## 2020-09-22 DIAGNOSIS — E7800 Pure hypercholesterolemia, unspecified: Secondary | ICD-10-CM

## 2020-09-22 DIAGNOSIS — R945 Abnormal results of liver function studies: Secondary | ICD-10-CM | POA: Diagnosis not present

## 2020-09-22 LAB — COMPREHENSIVE METABOLIC PANEL
ALT: 48 U/L — ABNORMAL HIGH (ref 0–44)
AST: 53 U/L — ABNORMAL HIGH (ref 15–41)
Albumin: 3 g/dL — ABNORMAL LOW (ref 3.5–5.0)
Alkaline Phosphatase: 68 U/L (ref 38–126)
Anion gap: 8 (ref 5–15)
BUN: <5 mg/dL — ABNORMAL LOW (ref 6–20)
CO2: 30 mmol/L (ref 22–32)
Calcium: 8.7 mg/dL — ABNORMAL LOW (ref 8.9–10.3)
Chloride: 99 mmol/L (ref 98–111)
Creatinine, Ser: 0.42 mg/dL — ABNORMAL LOW (ref 0.44–1.00)
GFR, Estimated: >60 mL/min (ref >60)
Glucose, Bld: 128 mg/dL — ABNORMAL HIGH (ref 70–99)
Potassium: 2.7 mmol/L — CL (ref 3.5–5.1)
Sodium: 137 mmol/L (ref 135–145)
Total Bilirubin: 2.5 mg/dL — ABNORMAL HIGH (ref 0.3–1.2)
Total Protein: 5.5 g/dL — ABNORMAL LOW (ref 6.5–8.1)

## 2020-09-22 LAB — IGG 4: IgG, Subclass 4: 17 mg/dL (ref 2–96)

## 2020-09-22 LAB — CBC
HCT: 29.9 % — ABNORMAL LOW (ref 36.0–46.0)
Hemoglobin: 9.8 g/dL — ABNORMAL LOW (ref 12.0–15.0)
MCH: 33.2 pg (ref 26.0–34.0)
MCHC: 32.8 g/dL (ref 30.0–36.0)
MCV: 101.4 fL — ABNORMAL HIGH (ref 80.0–100.0)
Platelets: 112 10*3/uL — ABNORMAL LOW (ref 150–400)
RBC: 2.95 MIL/uL — ABNORMAL LOW (ref 3.87–5.11)
RDW: 13.7 % (ref 11.5–15.5)
WBC: 3.6 10*3/uL — ABNORMAL LOW (ref 4.0–10.5)
nRBC: 0 % (ref 0.0–0.2)

## 2020-09-22 MED ORDER — POLYETHYLENE GLYCOL 3350 17 G PO PACK: 17.0000 g | PACK | Freq: Two times a day (BID) | ORAL | Status: DC

## 2020-09-22 MED ORDER — POTASSIUM CHLORIDE 10 MEQ/100ML IV SOLN
10.0000 meq | INTRAVENOUS | Status: AC
  Administered 2020-09-22 (×4): 10 meq via INTRAVENOUS
  Filled 2020-09-22 (×4): qty 100

## 2020-09-22 MED ORDER — POLYETHYLENE GLYCOL 3350 17 G PO PACK
17.0000 g | PACK | Freq: Every day | ORAL | Status: DC
  Administered 2020-09-22 – 2020-09-25 (×4): 17 g via ORAL
  Filled 2020-09-22 (×3): qty 1

## 2020-09-22 MED ORDER — POTASSIUM CHLORIDE IN NACL 20-0.9 MEQ/L-% IV SOLN
INTRAVENOUS | Status: DC
  Filled 2020-09-22 (×11): qty 1000

## 2020-09-22 MED ORDER — LISINOPRIL 10 MG PO TABS
10.0000 mg | ORAL_TABLET | Freq: Every day | ORAL | Status: DC
  Administered 2020-09-22 – 2020-09-27 (×5): 10 mg via ORAL
  Filled 2020-09-22 (×4): qty 1

## 2020-09-22 MED ORDER — POTASSIUM CHLORIDE 2 MEQ/ML IV SOLN: INTRAVENOUS | Status: DC

## 2020-09-22 MED ORDER — POTASSIUM CHLORIDE CRYS ER 20 MEQ PO TBCR
40.0000 meq | EXTENDED_RELEASE_TABLET | Freq: Once | ORAL | Status: AC
  Administered 2020-09-22: 40 meq via ORAL
  Filled 2020-09-22: qty 2

## 2020-09-22 MED ORDER — OXYCODONE HCL 5 MG PO TABS
5.0000 mg | ORAL_TABLET | ORAL | Status: DC | PRN
  Administered 2020-09-22: 5 mg via ORAL
  Filled 2020-09-22: qty 1

## 2020-09-22 NOTE — Progress Notes (Signed)
NAME:  Hannah Farmer, MRN:  [id number], DOB:  [birth year], LOS: 3 ADMISSION DATE:  09/19/2020, CONSULTATION DATE: 09/21/2020 REFERRING MD: Dr. Lupita Leash, CHIEF COMPLAINT: Pancreatitis alcohol withdrawal hallucinosis  History of Present Illness:  Patient is a 50 year old female with PMHx significant for HTN, hypercholesterolemia, nonspecific tremors (undergoing workup for multiple sclerosis) and history of heavy alcohol who presented to [facility] 7/15 with abdominal pain.   On admission, workup was notable for anion gap acidosis, abnormal LFTs (transaminitis and hyperbilirubinemia), hyperglycemia and elevated lipase (1487).  Recent abdominal US 7/7 was unremarkable. CT A/P on admission demonstrated acute pancreatitis and severe diffuse hepatic steatosis. GI was consulted, noting the above findings in addition to hallucinations/tremor concerning for worsening EtOH withdrawal despite CIWA protocol.  PCCM consulted for management of EtOH withdrawal including ICU admission and Precedex.  Pertinent Medical History:    Acute pancreatitis 09/19/2020   Abnormal LFTs 08/03/2019   Hypercholesteremia 07/03/2019   Hot flashes 05/31/2019   Hypertension 05/31/2019   Significant Hospital Events: Including procedures, antibiotic start and stop dates in addition to other pertinent events   7/15 Admitted for epigastric pain, EtOH withdrawal 7/16 GI consult 7/17 PCCM consult for ICU/Precedex for withdrawal  Interim History / Subjective:  No significant events overnight Per husband at bedside, patient appears much better today Reported severe agitation and hallucinations yesterday Sister at bedside reports that patient did not recognize her yesterday Patient improved today, calm and answering questions appropriately Oriented to self, year and somewhat to place Recognizes family today  Objective:  Blood pressure (!) 138/98, pulse (!) 107, temperature 97.8 F (36.6 C), temperature source Axillary, resp. rate 20,  height $Remov'5\' 4"'THQJgB$  (1.626 m), weight 61.2 kg, SpO2 91 %.        Intake/Output Summary (Last 24 hours) at 09/22/2020 1107 Last data filed at 09/22/2020 1040 Gross per 24 hour  Intake 6789.49 ml  Output 250 ml  Net 6539.49 ml    Filed Weights   09/19/20 0920  Weight: 61.2 kg   Physical Examination: General: Acutely ill-appearing middle-aged female in NAD. HEENT: [place]/AT, anicteric sclera, PERRL, slightly dry mucous membranes. Neuro: Awake, oriented x 2 (self, year). Responds to verbal stimuli. Following commands consistently. Moves all 4 extremities spontaneously. Strength 5/5 in all 4 extremities. CV: RRR, no m/g/r. PULM: Breathing even and unlabored on RA. Lung fields CTAB anteriorly. GI: Soft, nontender, nondistended. Normoactive bowel sounds. Extremities: No LE edema noted. Bilateral soft wrist restraints in place. Skin: Warm/dry, no rashes.  Resolved Hospital Problem List     Assessment & Plan:  Acute alcohol withdrawal with delirium tremens Hallucinations secondary to EtOH withdrawal Presented with abdominal pain, altered with hallucinations. Hallucinations/tremor thought to be secondary to alcohol withdrawal in the setting of acute pancreatitis. - Remains in ICU on Precedex - Weaning Precedex as able for RASS  goal 0 to -1 - Mental status continues to improve - Continue CIWA - Bilateral wrist restraints PRN, remove as mental status improves  Acute pancreatitis associated with EtOH use Presented with acute EtOH withdrawal and abdominal pain. Lipase elevated at 1487. CT A/P demonstrated stranding around pancreas c/w acute pancreatitis. - Conservative management - NPO status, IV fluids - GI consulted, appreciate recommendations  Hypokalemia - Replete K today - F/u BMP 1600 - Monitor lytes closely  EtOH abuse Per family, long history of EtOH overuse/abuse. - Precedex/CIWA protocol as above - Consider alcohol cessation/rehabilitation resources when medically ready for  d/c  Transaminitis with hyperbilirubinemia AST/ALT elevated to 50s/60s on admission, Tbili 3.0,  Alk Phos WNL. CT A/P demonstrated severe diffuse hepatic steatosis. GI following. - Trend LFTs to normal  Thrombocytopenia Possibly related to liver dysfunction. - Trend CBC - GI following  Suspected multiple sclerosis Tremors Per chart review, undergoing workup for MS - Continue to monitor  Best Practice: (right click and "Reselect all SmartList Selections" daily)   Diet/type: Regular consistency (see orders) DVT prophylaxis:  GI prophylaxis: N/A Lines: N/A Foley:  N/A Code Status:  full code Last date of multidisciplinary goals of care discussion [family not available]  Critical care time: N/A   Rhae Lerner [facility] 09/22/20 11:13 AM  Please see [email or web address] for pager details.  From 7A-7P if no response, please call [phone] After hours, please call ELink [phone]

## 2020-09-22 NOTE — Progress Notes (Signed)
[facility] Gastroenterology Progress Note  Hannah Farmer 50 y.o. 09/26/1970  CC:  Pancreatitis  Subjective: Denies abdominal pain, nausea, vomiting. Still has not had a BM despite dulcolax suppository yesterday.  ROS : Review of Systems  Cardiovascular:  Negative for chest pain and palpitations.  Gastrointestinal:  Positive for constipation. Negative for abdominal pain, blood in stool, diarrhea, heartburn, melena, nausea and vomiting.    Objective: Vital signs in last 24 hours: Vitals:   09/22/20 0700 09/22/20 0800  BP: 118/81 (!) 138/98  Pulse: 76 (!) 107  Resp: (!) 29 20  Temp:    SpO2: (!) 89% 91%    Physical Exam:  General:  Lethargic, cooperative, no distress, currently oriented x 3; sister and husband at bedside  Head:  Normocephalic, without obvious abnormality, atraumatic  Eyes:  Scleral icterus, EOM's intact  Lungs:   Clear to auscultation bilaterally, respirations unlabored  Heart:  Regular rate and rhythm, S1, S2 normal  Abdomen:   Soft with mild diffuse tenderness to palpation, mildly distended, bowel sounds active all four quadrants though are higher pitched  Extremities: Extremities normal, atraumatic, no  edema    Lab Results: Recent Labs    09/21/20 0539 09/22/20 0250  NA 135 137  K 3.2* 2.7*  CL 94* 99  CO2 28 30  GLUCOSE 145* 128*  BUN <5* <5*  CREATININE 0.48 0.42*  CALCIUM 8.6* 8.7*   Recent Labs    09/21/20 0539 09/22/20 0250  AST 52* 53*  ALT 60* 48*  ALKPHOS 62 68  BILITOT 3.0* 2.5*  PROT 6.0* 5.5*  ALBUMIN 3.5 3.0*   Recent Labs    09/19/20 0930 09/19/20 1934 09/21/20 0539 09/22/20 0250  WBC 4.6   < > 4.4 3.6*  NEUTROABS 3.9  --   --   --   HGB 13.4   < > 10.9* 9.8*  HCT 40.1   < > 32.6* 29.9*  MCV 99.0   < > 100.9* 101.4*  PLT 147*   < > 99* 112*   < > = values in this interval not displayed.   No results for input(s): LABPROT, INR in the last 72 hours.    Assessment: Pancreatitis, presumable due to alcohol -BUN  <5/ Cr 0.42 -IgG 4 pending -Mildly elevated triglycerides (156) -Lipase 134 as of 7/17, improved from 1487 on admission   Severe hepatic steatosis -T. Bili 2.5/ AST 53/ ALT 48/ ALP 68, improving -Thrombocytopenia (platelets 112 K/uL)  Hypokalemia: Potassium 2.7  Plan: Continue IVF and supportive care.  Start Miralax daily, can increase to BID if needed.  Discussed with patient and patient's family the importance of long-term alcohol cessation.  Advised them that alcohol consumption can lead to recurrence of pancreatitis. They verbalized understanding.  [facility] will follow.   Edrick Kins PA-C 09/22/2020, 9:09 AM  Contact #  [phone]

## 2020-09-23 ENCOUNTER — Inpatient Hospital Stay (HOSPITAL_COMMUNITY): Payer: 59

## 2020-09-23 ENCOUNTER — Encounter: Payer: 59 | Admitting: Neurology

## 2020-09-23 DIAGNOSIS — E7800 Pure hypercholesterolemia, unspecified: Secondary | ICD-10-CM | POA: Diagnosis not present

## 2020-09-23 DIAGNOSIS — R945 Abnormal results of liver function studies: Secondary | ICD-10-CM | POA: Diagnosis not present

## 2020-09-23 LAB — COMPREHENSIVE METABOLIC PANEL
ALT: 46 U/L — ABNORMAL HIGH (ref 0–44)
AST: 56 U/L — ABNORMAL HIGH (ref 15–41)
Albumin: 2.7 g/dL — ABNORMAL LOW (ref 3.5–5.0)
Alkaline Phosphatase: 87 U/L (ref 38–126)
Anion gap: 8 (ref 5–15)
BUN: 5 mg/dL — ABNORMAL LOW (ref 6–20)
CO2: 24 mmol/L (ref 22–32)
Calcium: 8 mg/dL — ABNORMAL LOW (ref 8.9–10.3)
Chloride: 102 mmol/L (ref 98–111)
Creatinine, Ser: 0.35 mg/dL — ABNORMAL LOW (ref 0.44–1.00)
GFR, Estimated: >60 mL/min (ref >60)
Glucose, Bld: 193 mg/dL — ABNORMAL HIGH (ref 70–99)
Potassium: 3.1 mmol/L — ABNORMAL LOW (ref 3.5–5.1)
Sodium: 134 mmol/L — ABNORMAL LOW (ref 135–145)
Total Bilirubin: 2.6 mg/dL — ABNORMAL HIGH (ref 0.3–1.2)
Total Protein: 5.2 g/dL — ABNORMAL LOW (ref 6.5–8.1)

## 2020-09-23 LAB — CBC
HCT: 29.3 % — ABNORMAL LOW (ref 36.0–46.0)
Hemoglobin: 9.5 g/dL — ABNORMAL LOW (ref 12.0–15.0)
MCH: 33.6 pg (ref 26.0–34.0)
MCHC: 32.4 g/dL (ref 30.0–36.0)
MCV: 103.5 fL — ABNORMAL HIGH (ref 80.0–100.0)
Platelets: 151 10*3/uL (ref 150–400)
RBC: 2.83 MIL/uL — ABNORMAL LOW (ref 3.87–5.11)
RDW: 14.2 % (ref 11.5–15.5)
WBC: 3.8 10*3/uL — ABNORMAL LOW (ref 4.0–10.5)
nRBC: 0 % (ref 0.0–0.2)

## 2020-09-23 LAB — H PYLORI, IGM, IGG, IGA AB
H Pylori IgG: 0.21 Index Value (ref 0.00–0.79)
H. Pylogi, Iga Abs: <9 units (ref 0.0–8.9)
H. Pylogi, Igm Abs: 10.6 units — ABNORMAL HIGH (ref 0.0–8.9)

## 2020-09-23 LAB — MAGNESIUM: Magnesium: 0.9 mg/dL — CL (ref 1.7–2.4)

## 2020-09-23 LAB — PROTIME-INR
INR: 1 (ref 0.8–1.2)
Prothrombin Time: 12.8 seconds (ref 11.4–15.2)

## 2020-09-23 MED ORDER — POTASSIUM CHLORIDE CRYS ER 20 MEQ PO TBCR
40.0000 meq | EXTENDED_RELEASE_TABLET | Freq: Once | ORAL | Status: DC
  Filled 2020-09-23: qty 2

## 2020-09-23 MED ORDER — POTASSIUM CHLORIDE CRYS ER 20 MEQ PO TBCR
20.0000 meq | EXTENDED_RELEASE_TABLET | ORAL | Status: DC
Start: 2020-09-23 — End: 2020-09-23

## 2020-09-23 MED ORDER — POTASSIUM CHLORIDE 10 MEQ/100ML IV SOLN
10.0000 meq | INTRAVENOUS | Status: AC
  Administered 2020-09-23 (×3): 10 meq via INTRAVENOUS
  Filled 2020-09-23 (×3): qty 100

## 2020-09-23 MED ORDER — POTASSIUM CHLORIDE 10 MEQ/100ML IV SOLN
10.0000 meq | INTRAVENOUS | Status: AC
  Administered 2020-09-24 (×4): 10 meq via INTRAVENOUS
  Filled 2020-09-23 (×4): qty 100

## 2020-09-23 MED ORDER — FUROSEMIDE 10 MG/ML IJ SOLN
20.0000 mg | Freq: Once | INTRAMUSCULAR | Status: AC
  Administered 2020-09-24: 20 mg via INTRAVENOUS
  Filled 2020-09-23: qty 2

## 2020-09-23 MED ORDER — FUROSEMIDE 10 MG/ML IJ SOLN
40.0000 mg | Freq: Once | INTRAMUSCULAR | Status: AC
  Administered 2020-09-23: 40 mg via INTRAVENOUS
  Filled 2020-09-23: qty 4

## 2020-09-23 MED ORDER — DIVALPROEX SODIUM 250 MG PO DR TAB
250.0000 mg | DELAYED_RELEASE_TABLET | Freq: Three times a day (TID) | ORAL | Status: AC
  Administered 2020-09-23 – 2020-09-26 (×9): 250 mg via ORAL
  Filled 2020-09-23 (×9): qty 1

## 2020-09-23 MED ORDER — POTASSIUM CHLORIDE 10 MEQ/100ML IV SOLN
10.0000 meq | INTRAVENOUS | Status: AC
  Administered 2020-09-23 (×4): 10 meq via INTRAVENOUS
  Filled 2020-09-23 (×4): qty 100

## 2020-09-23 MED ORDER — CLONIDINE HCL 0.1 MG PO TABS
0.1000 mg | ORAL_TABLET | Freq: Two times a day (BID) | ORAL | Status: AC
  Administered 2020-09-23 – 2020-09-25 (×5): 0.1 mg via ORAL
  Filled 2020-09-23 (×5): qty 1

## 2020-09-23 MED ORDER — MAGNESIUM SULFATE 4 GM/100ML IV SOLN
4.0000 g | Freq: Once | INTRAVENOUS | Status: AC
  Administered 2020-09-23: 4 g via INTRAVENOUS
  Filled 2020-09-23: qty 100

## 2020-09-23 NOTE — Progress Notes (Signed)
[facility] Gastroenterology Progress Note  Hannah Farmer 50 y.o. [birth year]  CC:  Pancreatitis  Subjective: Denies abdominal pain, nausea, vomiting. Still has not had a BM despite dulcolax suppository yesterday.  ROS : Review of Systems  Cardiovascular:  Negative for chest pain and palpitations.  Gastrointestinal:  Positive for constipation. Negative for abdominal pain, blood in stool, diarrhea, heartburn, melena, nausea and vomiting.    Objective: Vital signs in last 24 hours: Vitals:   09/23/20 0800 09/23/20 0900  BP: (!) 115/93 (!) 117/99  Pulse: 99 81  Resp: (!) 25 (!) 24  Temp:    SpO2: 95% 98%    Physical Exam:  General:  Lethargic, cooperative, no distress, currently oriented x 3; sister at bedside  Head:  Normocephalic, without obvious abnormality, atraumatic  Eyes:  Scleral icterus, EOM's intact  Lungs:   Mild wheezing bilaterally, respirations unlabored, on O2 via [place]  Heart:  Regular rate and rhythm, S1, S2 normal  Abdomen:   Soft and nontender, mildly distended, bowel sounds active all four quadrants though are higher pitched  Extremities: Extremities normal, atraumatic, no  edema    Lab Results: Recent Labs    09/22/20 0250 09/23/20 0358  NA 137 134*  K 2.7* 3.1*  CL 99 102  CO2 30 24  GLUCOSE 128* 193*  BUN <5* 5*  CREATININE 0.42* 0.35*  CALCIUM 8.7* 8.0*  MG  --  0.9*    Recent Labs    09/22/20 0250 09/23/20 0358  AST 53* 56*  ALT 48* 46*  ALKPHOS 68 87  BILITOT 2.5* 2.6*  PROT 5.5* 5.2*  ALBUMIN 3.0* 2.7*    Recent Labs    09/22/20 0250 09/23/20 0358  WBC 3.6* 3.8*  HGB 9.8* 9.5*  HCT 29.9* 29.3*  MCV 101.4* 103.5*  PLT 112* 151    No results for input(s): LABPROT, INR in the last 72 hours.    Assessment: Pancreatitis, presumable due to alcohol. No evidence of biliary abnormalities on CT with contrast. -BUN <5/ Cr 0.35 -IgG 4 normal, autoimmune pancreatitis not likely -Mildly elevated triglycerides (156) -Lipase 134 as  of 7/17, improved from 1487 on admission   Severe hepatic steatosis.  Possibly a component of alcoholic hepatitis, but hepatic discriminant function <32 -T. Bili 2.6/ AST 56/ ALT 46/ ALP 87, improving -Thrombocytopenia (platelets 112 K/uL) -Hepatitis A Ab positive as of 08/2020, IgM negative  Hypokalemia: Potassium 2.7  Plan: Continue supportive care.  OK to decrease rate of IVF if needed from respiratory standpoint.  Increase Miralax  to BID dosing and after patient has a BM, can decrease to daily or PRN dosing.  Discussed with patient and patient's family the importance of long-term alcohol cessation.  Advised them that alcohol consumption can lead to recurrence of pancreatitis. They verbalized understanding.  Soft low-fat diet OK.  Low-fat diet for next 1 month.  [facility] will sign off.  Follow up in [facility] office in 2 months.  Please contact us if we can be of any further assistance during this hospital stay.  Edrick Kins PA-C 09/23/2020, 10:11 AM  Contact #  [phone]

## 2020-09-23 NOTE — Progress Notes (Addendum)
eLink Physician-Brief Progress Note Patient Name: Hannah Farmer DOB: [birth year] MRN: [id number]   Date of Service  09/23/2020  HPI/Events of Note  Patient with severe delirium, at risk for falling out of bed, Ativan with limited impact on the delirium. Patient with some "wheezing", CXR earlier today suggestive of hypervolemia. K+ low.  eICU Interventions  Depakote added for the ETOH delirium, bilateral wrist restraints ordered for safety, if these measures do not stabilize her will restart low dose Precedex Lasix 20 mg iv x 1, intravenous fluids reduced to 50 ml / hour, KCL replacement ordered.        Thomasene Lot Durwood Dittus 09/23/2020, 11:48 PM

## 2020-09-23 NOTE — Progress Notes (Signed)
Received call from RN that pt was experiencing hallucinations, had received Ativan as ordered.  Instructed to restart precidex at low dose and to titrate according to orders.

## 2020-09-23 NOTE — Progress Notes (Signed)
Pt sleeping. 

## 2020-09-23 NOTE — Progress Notes (Signed)
[facility] ADULT ICU REPLACEMENT PROTOCOL   The patient does apply for the [facility] Adult ICU Electrolyte Replacment Protocol based on the criteria listed below:   1.Exclusion criteria: TCTS patients, ECMO patients and Hypothermia Protocol, and   Dialysis patients 2. Is GFR >/= 30 ml/min? Yes.    Patient's GFR today is >60 3. Is SCr </= 2? Yes.   Patient's SCr is 0.35 mg/dL 4. Did SCr increase >/= 0.5 in 24 hours? No. 5.Pt's weight >40kg  Yes.   6. Abnormal electrolyte(s): K= 3.1  7. Electrolytes replaced per protocol 8.  Call MD STAT for K+ </= 2.5, Phos </= 1, or Mag </= 1 Physician:  n/a   Hannah Farmer 09/23/2020 5:07 AM

## 2020-09-23 NOTE — Progress Notes (Signed)
NAME:  Hannah Farmer, MRN:  [id number], DOB:  1971-03-07, LOS: 4 ADMISSION DATE:  09/19/2020, CONSULTATION DATE: 09/21/2020 REFERRING MD: Dr. Lupita Leash, CHIEF COMPLAINT: Pancreatitis alcohol withdrawal hallucinosis  History of Present Illness:  Patient is a 50 year old female with PMHx significant for HTN, hypercholesterolemia, nonspecific tremors (undergoing workup for multiple sclerosis) and history of heavy alcohol use who presented to [facility] 7/15 with abdominal pain.   On admission, workup was notable for anion gap acidosis, abnormal LFTs (transaminitis and hyperbilirubinemia), hyperglycemia and elevated lipase (1487).  Recent abdominal US 7/7 was unremarkable. CT A/P on admission demonstrated acute pancreatitis and severe diffuse hepatic steatosis. GI was consulted, noting the above findings in addition to hallucinations/tremor concerning for worsening EtOH withdrawal despite CIWA protocol.  PCCM consulted for management of EtOH withdrawal including ICU admission and Precedex.  Pertinent Medical History:    Acute pancreatitis 09/19/2020   Abnormal LFTs 08/03/2019   Hypercholesteremia 07/03/2019   Hot flashes 05/31/2019   Hypertension 05/31/2019   Significant Hospital Events: Including procedures, antibiotic start and stop dates in addition to other pertinent events   7/15 Admitted for epigastric pain, EtOH withdrawal 7/16 GI consult 7/17 PCCM consult for ICU/Precedex for withdrawal 7/18 Weaned off of Precedex during the day, recurrent agitation/hallucinations at night, Precedex restarted and temporary restraints resumed  Interim History / Subjective:  Agitated overnight Recurrent hallucinations and trying to climb out of bed without assistance Precedex restarted (weaned off yesterday, 7/18) Groggy, but appropriate this morning No longer agitated, sitter at bedside  Objective:  Blood pressure 109/82, pulse 75, temperature 97.6 F (36.4 C), temperature source Oral, resp. rate 18,  height $Remov'5\' 4"'XQZWjM$  (1.626 m), weight 61.2 kg, SpO2 99 %.        Intake/Output Summary (Last 24 hours) at 09/23/2020 0753 Last data filed at 09/23/2020 0728 Gross per 24 hour  Intake 3557.09 ml  Output 1450 ml  Net 2107.09 ml    Filed Weights   09/19/20 0920  Weight: 61.2 kg   Physical Examination: General: Acutely ill-appearing female in NAD. HEENT: [place]/AT, anicteric sclera, PERRL, dry mucous membranes. Neuro:  Awake, but drowsy. Appears oriented.  Responds to verbal stimuli. Following commands consistently. Moves all 4 extremities spontaneously.  CV: RRR, no m/g/r. PULM: Breathing even and unlabored on RA. Lung fields CTAB anteriorly. GI: Soft, nondistended, mild generalized TTP. Normoactive bowel sounds. Extremities: Trace symmetric BLE edema noted. Skin: Warm/dry, no rashes.  Resolved Hospital Problem List     Assessment & Plan:  Acute alcohol withdrawal with delirium tremens Hallucinations secondary to EtOH withdrawal Presented with abdominal pain, altered with hallucinations. Hallucinations/tremor thought to be secondary to alcohol withdrawal in the setting of acute pancreatitis. - Remains in ICU due to ongoing Precedex needs - Weaning as able for RASS goal 0 to -1 - Mental status improved during the day, ongoing agitation at night - Continue CIWA - Bilateral wrist restraints at night PRN, remove as mental status allows  Acute pancreatitis associated with EtOH use Presented with acute EtOH withdrawal and abdominal pain. Lipase elevated at 1487. CT A/P demonstrated stranding around pancreas c/w acute pancreatitis. - Conservative management - Slowly ADAT to full liquids - IV fluid resuscitation - GI consulted, appreciate recs  Hypokalemia - Replete K today, 7/19 (3.1) - Trend BMP - Monitor lytes closely (Na, K, Mg) in the setting of EtOH abuse  EtOH abuse Per family, long history of EtOH overuse/abuse. - Precedex/CIWA as above - Alcohol cessation/rehabilitation  resources when medically ready for discharge  Transaminitis  with hyperbilirubinemia AST/ALT elevated to 50s/60s on admission, Tbili 3.0, Alk Phos WNL. CT A/P demonstrated severe diffuse hepatic steatosis. GI following. - Trend LFTs to normal  Thrombocytopenia Possibly related to liver dysfunction. - Trend CBC - GI following  Suspected multiple sclerosis Tremors Per chart review, undergoing workup for MS - Continue to monitor  Best Practice: (right click and "Reselect all SmartList Selections" daily)   Diet/type: full liquids  DVT prophylaxis:  GI prophylaxis: N/A Lines: N/A Foley:  N/A Code Status:  full code Last date of multidisciplinary goals of care discussion [family not available]  Critical care time: N/A   Rhae [facility]ner [facility] 09/23/20 7:53 AM  Please see [email or web address] for pager details.  From 7A-7P if no response, please call [phone] After hours, please call ELink [phone]

## 2020-09-23 NOTE — Progress Notes (Signed)
eLink Physician-Brief Progress Note Patient Name: Hannah Farmer DOB: November 13, 1970 MRN: [id number]   Date of Service  09/23/2020  HPI/Events of Note  Agitation - Trying to get OOB, pulling at IV's and FM O2. Nursing request for bilateral soft wrist restraint.  eICU Interventions  Will order bilateral soft wrist restraints X 7 hours.     Intervention Category Major Interventions: Delirium, psychosis, severe agitation - evaluation and management  [place] 09/23/2020, 2:06 AM

## 2020-09-24 DIAGNOSIS — I10 Essential (primary) hypertension: Secondary | ICD-10-CM

## 2020-09-24 DIAGNOSIS — K859 Acute pancreatitis without necrosis or infection, unspecified: Secondary | ICD-10-CM | POA: Diagnosis not present

## 2020-09-24 DIAGNOSIS — R945 Abnormal results of liver function studies: Secondary | ICD-10-CM | POA: Diagnosis not present

## 2020-09-24 DIAGNOSIS — F10231 Alcohol dependence with withdrawal delirium: Secondary | ICD-10-CM | POA: Diagnosis not present

## 2020-09-24 LAB — CBC
HCT: 33.1 % — ABNORMAL LOW (ref 36.0–46.0)
Hemoglobin: 10.7 g/dL — ABNORMAL LOW (ref 12.0–15.0)
MCH: 33.9 pg (ref 26.0–34.0)
MCHC: 32.3 g/dL (ref 30.0–36.0)
MCV: 104.7 fL — ABNORMAL HIGH (ref 80.0–100.0)
Platelets: 195 10*3/uL (ref 150–400)
RBC: 3.16 MIL/uL — ABNORMAL LOW (ref 3.87–5.11)
RDW: 14.8 % (ref 11.5–15.5)
WBC: 4.3 10*3/uL (ref 4.0–10.5)
nRBC: 0.7 % — ABNORMAL HIGH (ref 0.0–0.2)

## 2020-09-24 LAB — BASIC METABOLIC PANEL
Anion gap: 11 (ref 5–15)
BUN: <5 mg/dL — ABNORMAL LOW (ref 6–20)
CO2: 25 mmol/L (ref 22–32)
Calcium: 8.1 mg/dL — ABNORMAL LOW (ref 8.9–10.3)
Chloride: 99 mmol/L (ref 98–111)
Creatinine, Ser: 0.55 mg/dL (ref 0.44–1.00)
GFR, Estimated: >60 mL/min (ref >60)
Glucose, Bld: 196 mg/dL — ABNORMAL HIGH (ref 70–99)
Potassium: 3.9 mmol/L (ref 3.5–5.1)
Sodium: 135 mmol/L (ref 135–145)

## 2020-09-24 LAB — MAGNESIUM: Magnesium: 1.6 mg/dL — ABNORMAL LOW (ref 1.7–2.4)

## 2020-09-24 MED ORDER — MAGNESIUM SULFATE 4 GM/100ML IV SOLN
4.0000 g | Freq: Once | INTRAVENOUS | Status: AC
  Administered 2020-09-24: 4 g via INTRAVENOUS
  Filled 2020-09-24: qty 100

## 2020-09-24 MED ORDER — LORAZEPAM 2 MG/ML IJ SOLN: 1.0000 mg | INTRAMUSCULAR | Status: DC | PRN

## 2020-09-24 MED ORDER — DEXMEDETOMIDINE HCL IN NACL 200 MCG/50ML IV SOLN
0.2000 ug/kg/h | INTRAVENOUS | Status: DC
  Administered 2020-09-24: 0.2 ug/kg/h via INTRAVENOUS
  Filled 2020-09-24: qty 50

## 2020-09-24 MED ORDER — SODIUM CHLORIDE 0.9 % IV SOLN
1.0000 mg | Freq: Once | INTRAVENOUS | Status: AC
  Administered 2020-09-24: 1 mg via INTRAVENOUS
  Filled 2020-09-24: qty 0.2

## 2020-09-24 MED ORDER — THIAMINE HCL 100 MG/ML IJ SOLN
100.0000 mg | Freq: Every day | INTRAMUSCULAR | Status: DC
  Administered 2020-09-24 – 2020-09-27 (×4): 100 mg via INTRAVENOUS
  Filled 2020-09-24 (×4): qty 2

## 2020-09-24 MED ORDER — CHLORDIAZEPOXIDE HCL 25 MG PO CAPS
25.0000 mg | ORAL_CAPSULE | ORAL | Status: AC
  Administered 2020-09-24 – 2020-09-25 (×4): 25 mg via ORAL
  Filled 2020-09-24 (×3): qty 1

## 2020-09-24 MED ORDER — CHLORDIAZEPOXIDE HCL 25 MG PO CAPS
25.0000 mg | ORAL_CAPSULE | Freq: Every day | ORAL | Status: AC
  Administered 2020-09-26: 25 mg via ORAL
  Filled 2020-09-24: qty 1

## 2020-09-24 NOTE — Progress Notes (Addendum)
NAME:  Hannah Farmer, MRN:  [id number], DOB:  October 10, 1970, LOS: 5 ADMISSION DATE:  09/19/2020, CONSULTATION DATE: 09/21/2020 REFERRING MD: Dr. Lupita Leash, CHIEF COMPLAINT: Pancreatitis alcohol withdrawal hallucinosis  History of Present Illness:  50 year old female with PMHx significant for HTN, hypercholesterolemia, nonspecific tremors (undergoing workup for multiple sclerosis) and history of heavy alcohol use who presented to [facility] 7/15 with abdominal pain.   On admission, workup was notable for anion gap acidosis, abnormal LFTs (transaminitis and hyperbilirubinemia), hyperglycemia and elevated lipase (1487).  Recent abdominal US 7/7 was unremarkable. CT A/P on admission demonstrated acute pancreatitis and severe diffuse hepatic steatosis. GI was consulted, noting the above findings in addition to hallucinations/tremor concerning for worsening EtOH withdrawal despite CIWA protocol.  PCCM consulted for management of EtOH withdrawal including ICU admission and Precedex.  Pertinent Medical History:  Acute Pancreatitis - 09/2020  HTN HLD Non-specific tremors  Hot Flashes   Significant Hospital Events: Including procedures, antibiotic start and stop dates in addition to other pertinent events   7/15 Admitted for epigastric pain, EtOH withdrawal 7/16 GI consult 7/17 PCCM consult for ICU/Precedex for withdrawal 7/18 Weaned off of Precedex during the day, recurrent agitation/hallucinations at night, Precedex restarted and temporary restraints resumed 7/19 Agitated overnight, hallucinations / trying to get out of bed, precedex restarted, sitter  Interim History / Subjective:  Afebrile  2L O2  Glucose range 128-196 I/O 5.6L UOP, -2.4L in last 24 hours  Received ~$RemoveBe'27mg'NDzVFTlbZ$  IV ativan overnight for withdrawal, on precedex + librium + depakote + clonidine   Objective:  Blood pressure 128/88, pulse (!) 104, temperature 98.5 F (36.9 C), temperature source Oral, resp. rate (!) 24, height $RemoveBe'5\' 4"'iHltMcLVO$  (1.626 m),  weight 61.2 kg, SpO2 97 %.        Intake/Output Summary (Last 24 hours) at 09/24/2020 3570 Last data filed at 09/24/2020 1779 Gross per 24 hour  Intake 3255.75 ml  Output 5650 ml  Net -2394.25 ml   Filed Weights   09/19/20 0920  Weight: 61.2 kg   Physical Examination: General: adult female lying in bed in NAD HEENT: MM pink/moist, wearing glasses, [place] O2, anicteric, pupils 3mm  Neuro: sedate, sternal rub arouses patient - she states "excuse me", moves all extremities (pushes self up in bed) and then drifts back to sleep CV: s1s2 RRR, SR 70-80's on monitor, no m/r/g PULM: non-labored on [place] O2, crackles bilaterally  GI: soft, bsx4 active  Extremities: warm/dry, no edema, webbed 2nd/3rd toes  Skin: no rashes or lesions  Resolved Hospital Problem List     Assessment & Plan:   Acute alcohol withdrawal with delirium tremens Hallucinations secondary to EtOH withdrawal Presented with abdominal pain, altered with hallucinations. Hallucinations/tremor thought to be secondary to alcohol withdrawal in the setting of acute pancreatitis. -monitor in ICU  -continue precedex with PRN ativan with adjusted dosing  -RASS Goal 0 to -1 -follow CIWA score  -restraints and sitter for safety, remove when able  -folate, thiamine, MVI -monitor respiratory status closely   Acute pancreatitis associated with EtOH use Presented with acute EtOH withdrawal and abdominal pain. Lipase elevated at 1487. CT A/P demonstrated stranding around pancreas c/w acute pancreatitis. -IVF  -conservative management in ICU, follow exam  -appreciate GI evaluation   Hypokalemia Hypomagnesemia  -monitor, replace as indicated -KCL + Mg+ 7/20  -Replete K today, 7/19 (3.1)  EtOH abuse Per family, long history of EtOH overuse/abuse. -medications as above -cessation counseling when mental status permits  Transaminitis with hyperbilirubinemia AST/ALT elevated to 50s/60s on  admission, Tbili 3.0, Alk Phos WNL. CT A/P  demonstrated severe diffuse hepatic steatosis. GI following. -follow LFT's  Macrocytic Anemia  Thrombocytopenia Suspect related to liver dysfunction, ETOH abuse  -trend CBC -monitor for bleeding   Suspected multiple sclerosis Tremors Per chart review, undergoing workup for MS -supportive care, no acute interventions -follow up as outpatient   Best Practice: (right click and "Reselect all SmartList Selections" daily)  Diet/type: full liquids  DVT prophylaxis:  GI prophylaxis: N/A Lines: N/A Foley:  N/A Code Status:  full code Last date of multidisciplinary goals of care discussion: pending   Critical care time: 68 minutes   Noe Gens, MSN, APRN, NP-C, AGACNP-BC [facility] 09/24/2020, 7:28 AM   Please see [email or web address] for pager details.   From 7A-7P if no response, please call [phone] After hours, please call ELink [phone]

## 2020-09-24 NOTE — TOC Initial Note (Signed)
Transition of Care [facility]) - Initial/Assessment Note    Patient Details  Name: Hannah Farmer MRN: [id number] Date of Birth: Nov 06, 1970  [facility]) CM/SW Contact:    Golda Acre, RN Phone Number: 09/24/2020, 8:11 AM  Clinical Narrative:                 50 year old female with PMHx significant for HTN, hypercholesterolemia, nonspecific tremors (undergoing workup for multiple sclerosis) and history of heavy alcohol use who presented to [facility] 7/15 with abdominal pain.   On admission, workup was notable for anion gap acidosis, abnormal LFTs (transaminitis and hyperbilirubinemia), hyperglycemia and elevated lipase (1487).  Recent abdominal US 7/7 was unremarkable. CT A/P on admission demonstrated acute pancreatitis and severe diffuse hepatic steatosis. GI was consulted, noting the above findings in addition to hallucinations/tremor concerning for worsening EtOH withdrawal despite CIWA protocol.   PCCM consulted for management of EtOH withdrawal including ICU admission and Precedex.  TOC PLAN OF CARE: following for home needs, will need substance abuse resources when stable. Pertinent Medical History:  Acute Pancreatitis - 09/2020 HTN HLD Non-specific tremors Hot Flashes    Significant Hospital Events: Including procedures, antibiotic start and stop dates in addition to other pertinent events  7/15 Admitted for epigastric pain, EtOH withdrawal 7/16 GI consult 7/17 PCCM consult for ICU/Precedex for withdrawal 7/18 Weaned off of Precedex during the day, recurrent agitation/hallucinations at night, Precedex restarted and temporary restraints resumed 7/19 Agitated overnight, hallucinations / trying to get out of bed, precedex restarted, sitter    Expected Discharge Plan: Home/Self Care Barriers to Discharge: Continued Medical Work up   Patient Goals and CMS Choice        Expected Discharge Plan and Services Expected Discharge Plan: Home/Self Care   Discharge  Planning Services: CM Consult   Living arrangements for the past 2 months: Single Family Home                                      Prior Living Arrangements/Services Living arrangements for the past 2 months: Single Family Home Lives with:: Spouse Patient language and need for interpreter reviewed:: Yes Do you feel safe going back to the place where you live?: Yes            Criminal Activity/Legal Involvement Pertinent to Current Situation/Hospitalization: No - Comment as needed  Activities of Daily Living Home Assistive Devices/Equipment: Eyeglasses, Contact lenses ADL Screening (condition at time of admission) Patient's cognitive ability adequate to safely complete daily activities?: Yes Is the patient deaf or have difficulty hearing?: No Does the patient have difficulty seeing, even when wearing glasses/contacts?: Yes (needs a new prescription for glasses per pt) Does the patient have difficulty concentrating, remembering, or making decisions?: Yes (concentration is worse) Patient able to express need for assistance with ADLs?: Yes Does the patient have difficulty dressing or bathing?: Yes Independently performs ADLs?: No Communication: Independent Dressing (OT): Independent Grooming: Independent Feeding: Independent Bathing: Independent Toileting: Needs assistance Is this a change from baseline?: Change from baseline, expected to last >3days In/Out Bed: Needs assistance Is this a change from baseline?: Change from baseline, expected to last >3 days Walks in Home: Needs assistance Is this a change from baseline?: Change from baseline, expected to last >3 days Does the patient have difficulty walking or climbing stairs?: Yes Weakness of Legs: Both Weakness of Arms/Hands: Both  Permission Sought/Granted  Emotional Assessment Appearance:: Appears stated age     Orientation: : Fluctuating Orientation (Suspected and/or reported  Sundowners) Alcohol / Substance Use: Alcohol Use Psych Involvement: No (comment)  Admission diagnosis:  Acute pancreatitis [K85.90] Dehydration [E86.0] Intractable vomiting with nausea, unspecified vomiting type [R11.2] Acute pancreatitis, unspecified complication status, unspecified pancreatitis type [K85.90] Patient Active Problem List   Diagnosis Date Noted   Acute pancreatitis 09/19/2020   Abnormal LFTs 08/03/2019   Hypercholesteremia 07/03/2019   Hot flashes 05/31/2019   Hypertension 05/31/2019   PCP:  Arvilla Market, MD Pharmacy:   [facility] [phone] [place], [place] - [address] [place] [address] Phone: [phone] Fax: [phone]     Social Determinants of Health (SDOH) Interventions    Readmission Risk Interventions No flowsheet data found.

## 2020-09-24 NOTE — Progress Notes (Signed)
[facility] ADULT ICU REPLACEMENT PROTOCOL   The patient does apply for the [facility] Adult ICU Electrolyte Replacment Protocol based on the criteria listed below:   1.Exclusion criteria: TCTS patients, ECMO patients and Hypothermia Protocol, and   Dialysis patients 2. Is GFR >/= 30 ml/min? Yes.    Patient's GFR today is >60 3. Is SCr </= 2? No. Patient's SCr is 0.55 mg/dL 4. Did SCr increase >/= 0.5 in 24 hours? No. 5.Pt's weight >40kg  Yes.   6. Abnormal electrolyte(s): mag 1.6  7. Electrolytes replaced per protocol 8.  Call MD STAT for K+ </= 2.5, Phos </= 1, or Mag </= 1 Physician:  nn/a   Hannah Farmer 09/24/2020 5:14 AM

## 2020-09-25 DIAGNOSIS — I10 Essential (primary) hypertension: Secondary | ICD-10-CM | POA: Diagnosis not present

## 2020-09-25 DIAGNOSIS — F10231 Alcohol dependence with withdrawal delirium: Secondary | ICD-10-CM | POA: Diagnosis not present

## 2020-09-25 DIAGNOSIS — K859 Acute pancreatitis without necrosis or infection, unspecified: Secondary | ICD-10-CM | POA: Diagnosis not present

## 2020-09-25 DIAGNOSIS — R945 Abnormal results of liver function studies: Secondary | ICD-10-CM | POA: Diagnosis not present

## 2020-09-25 LAB — CBC
HCT: 31.1 % — ABNORMAL LOW (ref 36.0–46.0)
Hemoglobin: 9.9 g/dL — ABNORMAL LOW (ref 12.0–15.0)
MCH: 33.9 pg (ref 26.0–34.0)
MCHC: 31.8 g/dL (ref 30.0–36.0)
MCV: 106.5 fL — ABNORMAL HIGH (ref 80.0–100.0)
Platelets: 209 10*3/uL (ref 150–400)
RBC: 2.92 MIL/uL — ABNORMAL LOW (ref 3.87–5.11)
RDW: 15.4 % (ref 11.5–15.5)
WBC: 2.9 10*3/uL — ABNORMAL LOW (ref 4.0–10.5)
nRBC: 0 % (ref 0.0–0.2)

## 2020-09-25 LAB — BASIC METABOLIC PANEL
Anion gap: 13 (ref 5–15)
BUN: <5 mg/dL — ABNORMAL LOW (ref 6–20)
CO2: 24 mmol/L (ref 22–32)
Calcium: 8.6 mg/dL — ABNORMAL LOW (ref 8.9–10.3)
Chloride: 102 mmol/L (ref 98–111)
Creatinine, Ser: 0.49 mg/dL (ref 0.44–1.00)
GFR, Estimated: >60 mL/min (ref >60)
Glucose, Bld: 162 mg/dL — ABNORMAL HIGH (ref 70–99)
Potassium: 3.8 mmol/L (ref 3.5–5.1)
Sodium: 139 mmol/L (ref 135–145)

## 2020-09-25 LAB — MAGNESIUM: Magnesium: 2.2 mg/dL (ref 1.7–2.4)

## 2020-09-25 NOTE — Progress Notes (Signed)
NAME:  Hannah Farmer, MRN:  [id number], DOB:  [birth year], LOS: 6 ADMISSION DATE:  09/19/2020, CONSULTATION DATE: 09/21/2020 REFERRING MD: Dr. Lupita Leash, CHIEF COMPLAINT: Pancreatitis alcohol withdrawal hallucinations  History of Present Illness:  50 year old female with PMHx significant for HTN, hypercholesterolemia, nonspecific tremors (undergoing workup for multiple sclerosis) and history of heavy alcohol use who presented to [facility] 7/15 with abdominal pain.   On admission, workup was notable for anion gap acidosis, abnormal LFTs (transaminitis and hyperbilirubinemia), hyperglycemia and elevated lipase (1487).  Recent abdominal US 7/7 was unremarkable. CT A/P on admission demonstrated acute pancreatitis and severe diffuse hepatic steatosis. GI was consulted, noting the above findings in addition to hallucinations/tremor concerning for worsening EtOH withdrawal despite CIWA protocol.  PCCM consulted for management of EtOH withdrawal including ICU admission and Precedex.  Pertinent Medical History:  Acute Pancreatitis - 09/2020  HTN HLD Non-specific tremors  Hot Flashes   Significant Hospital Events: Including procedures, antibiotic start and stop dates in addition to other pertinent events   7/15 Admitted for epigastric pain, EtOH withdrawal 7/16 GI consult 7/17 PCCM consult for ICU/Precedex for withdrawal 7/18 Weaned off of Precedex during the day, recurrent agitation/hallucinations at night, Precedex restarted and temporary restraints resumed 7/19 Agitated overnight, hallucinations / trying to get out of bed, precedex restarted, sitter 7/20 Precedex off early am.  Calm during day. No precedex or ativan overnight  Interim History / Subjective:  3L [place]  Afebrile  I/O 1.7L UOP, -941ml in last 24 hours  No issues overnight with withdrawal - did not require ativan or precedex. Pt denies shaking or jitteriness   Eating breakfast, sister at bedside  Objective:  Blood pressure 125/88, pulse  84, temperature 98.2 F (36.8 C), temperature source Oral, resp. rate 19, height $RemoveBe'5\' 4"'SNDllbPhz$  (1.626 m), weight 61.2 kg, SpO2 95 %.        Intake/Output Summary (Last 24 hours) at 09/25/2020 0720 Last data filed at 09/25/2020 0400 Gross per 24 hour  Intake 860.24 ml  Output 1750 ml  Net -889.76 ml   Filed Weights   09/19/20 0920  Weight: 61.2 kg   Physical Examination: General: adult female lying in bed in NAD, sister at bedside  HEENT: MM pink/moist, wearing glasses, anicteric  Neuro: AAOx4, speech clear, MAE, significantly more alert than yesterday CV: s1s2 RRR, no m/r/g PULM: non-labored at rest, lungs clear bilaterally GI: soft, bsx4 active  Extremities: warm/dry, no edema, trace dependent edema in hands Skin: no rashes or lesions  Resolved Hospital Problem List     Assessment & Plan:   Acute alcohol withdrawal with delirium tremens Hallucinations secondary to EtOH withdrawal Presented with abdominal pain, altered with hallucinations. Hallucinations/tremor thought to be secondary to alcohol withdrawal in the setting of acute pancreatitis. -transfer to floor  -discontinue precedex off MAR -PRN ativan  -continue librium taper -stop clonidine after am dosin g -valproate in place with stop date  -follow CIWA score  -folate, thiamine, MVI -will ask psychiatry to evaluate patient and offer any resources for patient   Acute pancreatitis associated with EtOH use Presented with acute EtOH withdrawal and abdominal pain. Lipase elevated at 1487. CT A/P demonstrated stranding around pancreas c/w acute pancreatitis. -IVF until reliably taking PO's  -conservative management, monitor exam findings -appreciate GI input  Hypokalemia Hypomagnesemia  -monitor labs, replace as indicated  -anticipate as she eats, labs will normalize > follow exam with liberalizing diet   EtOH abuse Per family, long history of EtOH overuse/abuse. -meds as above  -discussed  cessation with patient and  sister   Transaminitis with hyperbilirubinemia AST/ALT elevated to 50s/60s on admission, Tbili 3.0, Alk Phos WNL. CT A/P demonstrated severe diffuse hepatic steatosis. GI following. -follow LFT's intermittently, repeat CMP in am  Macrocytic Anemia  Thrombocytopenia Suspect related to liver dysfunction, ETOH abuse  -trend CBC -transfuse for Hgb <7% or active bleeding   Suspected multiple sclerosis Tremors Per chart review, undergoing workup for MS -supportive care, ? If tremor was ETOH related.  Pt has never been hospitalized for ETOH.  -follow up as outpatient   HTN -continue home lisinopril   Best Practice: (right click and "Reselect all SmartList Selections" daily)  Diet/type: Regular consistency (see orders) DVT prophylaxis: lovenox  GI prophylaxis: N/A Lines: N/A Foley:  N/A Code Status:  full code Last date of multidisciplinary goals of care discussion: 7/21 - reviewed plan with patient and family.   Critical care time:    Noe Gens, MSN, APRN, NP-C, AGACNP-BC [place] [facility] 09/25/2020, 7:20 AM   Please see [email or web address] for pager details.   From 7A-7P if no response, please call [phone] After hours, please call ELink [phone]

## 2020-09-26 DIAGNOSIS — F10239 Alcohol dependence with withdrawal, unspecified: Secondary | ICD-10-CM

## 2020-09-26 DIAGNOSIS — K852 Alcohol induced acute pancreatitis without necrosis or infection: Principal | ICD-10-CM

## 2020-09-26 DIAGNOSIS — F1020 Alcohol dependence, uncomplicated: Secondary | ICD-10-CM

## 2020-09-26 LAB — MRSA NEXT GEN BY PCR, NASAL: MRSA by PCR Next Gen: NOT DETECTED

## 2020-09-26 LAB — COMPREHENSIVE METABOLIC PANEL
ALT: 50 U/L — ABNORMAL HIGH (ref 0–44)
AST: 46 U/L — ABNORMAL HIGH (ref 15–41)
Albumin: 2.9 g/dL — ABNORMAL LOW (ref 3.5–5.0)
Alkaline Phosphatase: 116 U/L (ref 38–126)
Anion gap: 9 (ref 5–15)
BUN: <5 mg/dL — ABNORMAL LOW (ref 6–20)
CO2: 25 mmol/L (ref 22–32)
Calcium: 8.9 mg/dL (ref 8.9–10.3)
Chloride: 106 mmol/L (ref 98–111)
Creatinine, Ser: 0.46 mg/dL (ref 0.44–1.00)
GFR, Estimated: >60 mL/min (ref >60)
Glucose, Bld: 166 mg/dL — ABNORMAL HIGH (ref 70–99)
Potassium: 3.9 mmol/L (ref 3.5–5.1)
Sodium: 140 mmol/L (ref 135–145)
Total Bilirubin: 1.1 mg/dL (ref 0.3–1.2)
Total Protein: 6 g/dL — ABNORMAL LOW (ref 6.5–8.1)

## 2020-09-26 LAB — CBC
HCT: 31.8 % — ABNORMAL LOW (ref 36.0–46.0)
Hemoglobin: 10.2 g/dL — ABNORMAL LOW (ref 12.0–15.0)
MCH: 33.6 pg (ref 26.0–34.0)
MCHC: 32.1 g/dL (ref 30.0–36.0)
MCV: 104.6 fL — ABNORMAL HIGH (ref 80.0–100.0)
Platelets: 252 10*3/uL (ref 150–400)
RBC: 3.04 MIL/uL — ABNORMAL LOW (ref 3.87–5.11)
RDW: 15.3 % (ref 11.5–15.5)
WBC: 2.8 10*3/uL — ABNORMAL LOW (ref 4.0–10.5)
nRBC: 0 % (ref 0.0–0.2)

## 2020-09-26 MED ORDER — LABETALOL HCL 5 MG/ML IV SOLN
5.0000 mg | INTRAVENOUS | Status: DC | PRN
  Administered 2020-09-26: 5 mg via INTRAVENOUS
  Filled 2020-09-26: qty 4

## 2020-09-26 MED ORDER — POLYETHYLENE GLYCOL 3350 17 G PO PACK
17.0000 g | PACK | Freq: Two times a day (BID) | ORAL | Status: DC
  Administered 2020-09-26 – 2020-09-28 (×5): 17 g via ORAL
  Filled 2020-09-26 (×5): qty 1

## 2020-09-26 MED ORDER — MELATONIN 5 MG PO TABS
5.0000 mg | ORAL_TABLET | Freq: Once | ORAL | Status: AC
  Administered 2020-09-27: 5 mg via ORAL
  Filled 2020-09-26 (×2): qty 1

## 2020-09-26 MED ORDER — ORAL CARE MOUTH RINSE
15.0000 mL | Freq: Two times a day (BID) | OROMUCOSAL | Status: DC
  Administered 2020-09-26 – 2020-09-27 (×4): 15 mL via OROMUCOSAL

## 2020-09-26 NOTE — Consult Note (Signed)
50 year old female with PMHx significant for HTN, hypercholesterolemia, nonspecific tremors (undergoing workup for multiple sclerosis) and history of heavy alcohol use who presented to [facility] 7/15 with abdominal pain.  She was found to have acute pancreatitis.  She was hospitalized and gastroenterology was consulted.  Subsequently she went into severe alcohol withdrawal requiring transfer to intensive care unit.  By critical care medicine and was placed on Precedex.  Stabilized and then transferred back to medicine service.   Psych consult placed for EtOH abuse, first admission for withdrawal.  Depression.  Consider rehab options. Hannah Farmer is a 50 year old female with history significant for severe alcohol use disorder, depression who was found to have acute pancreatitis, and subsequently developed severe alcohol withdrawal that resulted in intensive care admission.  During this admission patient developed worsening hallucinations in context of alcohol withdrawal.  Per chart review patient has multiple history of alcohol withdrawal symptoms, complications associated with severe alcohol use.  On evaluation patient denies depression, anxiety, irritability, hallucinations.  She is linear, coherent, with improved affect this morning.  Patient states she is amenable to receiving substance use treatment, and is open to treatment options.  We were able to review inpatient residential substance abuse programs to include [facility], [facility], [facility].  We also reviewed outpatient intensive programming.  Patient and family have been provided with a list of resources in the community, to include contacting insurance company to see which services are in network.  She does appear to be hesitant about inpatient treatment, noting she has already been in the hospital x1 week and has children at home.  Family members point out that her 2 sons are adult mean, and no more than capable of taking care of themselves while she  goes inpatient.  Despite longstanding history of alcohol use disorder, patient denies any previous suicidal thoughts, suicidal attempts, and or self-harm behaviors.    At present she continues to adamantly refute suicidal ideations, suicidal thoughts, and self-harm.  She further denies homicidal ideations, hallucinations, psychosis, paranoia.  She answers all questions appropriately.  Also spoke with family at the bedside, who also appears to be interested in inpatient substance abuse treatment with emphasis on Christianity.  We reviewed [facility] as a #1 option with great reviews.  Upon termination of interview patient does state" I would like to think about it."  She is advised she can make appropriate choices, considering she is not suicidal and there is no need to place her under involuntary commitment.  We also reviewed outcomes, which is better if the patient is willing to go voluntarily.    I explored the effects of alcohol on her mental and medical health.  Patient does admit to negative effects of alcohol to include pancreatitis, likely alcohol induced hepatitis.   Risks especially on effects of substances with respect to suicide was discussed with her.Effects of substances seems to have worn off.  Her mood has dramatically improved.  She does seem to be motivated to engage in relapse preventative measures at this time, however would like time to think about appropriate decision.  We discussed the importance of her going straight into inpatient residential, to avoid her returning to her previous home environment.    -At this time patient does not meet inpatient psychiatric criteria. -Per consult we have reviewed inpatient versus outpatient alcohol use substance programs. -As noted patient will benefit from inpatient residential treatment, with transition into outpatient intensive programming. -TOC to provide options, however did not speak with patient  about options.  Will recommend revisiting  patient once she is awake and transferred out of intensive care.  Per family" she left the papers on the bed.  She never talked to [facility] because she was asleep.  I was here."

## 2020-09-26 NOTE — TOC Progression Note (Signed)
Transition of Care [facility]) - Progression Note    Patient Details  Name: Hannah Farmer MRN: [id number] Date of Birth: 1971/01/09  Transition of Care [facility]) CM/SW Contact  Golda Acre, RN Phone Number: 09/26/2020, 7:37 AM  Clinical Narrative:    Patient is now stable and alert will give substance abuse resources to patient.   Expected Discharge Plan: Home/Self Care Barriers to Discharge: Continued Medical Work up  Expected Discharge Plan and Services Expected Discharge Plan: Home/Self Care   Discharge Planning Services: CM Consult   Living arrangements for the past 2 months: Single Family Home                                       Social Determinants of Health (SDOH) Interventions    Readmission Risk Interventions No flowsheet data found.

## 2020-09-26 NOTE — Progress Notes (Addendum)
[facility] PROGRESS NOTE   Hannah Farmer IRC:[id number] DOB: 02/24/1971 DOA: 09/19/2020  PCP: Arvilla Market, MD  Brief History/Interval Summary: 50 year old female with PMHx significant for HTN, hypercholesterolemia, nonspecific tremors (undergoing workup for multiple sclerosis) and history of heavy alcohol use who presented to [facility] 7/15 with abdominal pain.  She was found to have acute pancreatitis.  She was hospitalized and gastroenterology was consulted.  Subsequently she went into severe alcohol withdrawal requiring transfer to intensive care unit.  By critical care medicine and was placed on Precedex.  Stabilized and then transferred back to medicine service  Consultants: Seen by gastroenterology and critical care medicine both of whom have signed off  Procedures: None  Antibiotics: Anti-infectives (From admission, onward)    None       Subjective/Interval History: Patient mentions that she is feeling better.  Tolerating her diet.  Continues to have some abdominal discomfort.  Has not had a bowel movement in several days.  States that she is not passing any gas either.   Assessment/Plan:  Alcohol withdrawal syndrome with delirium tremens Symptoms were severe that she had to be transferred to the intensive care unit.  She was seen by critical care medicine and placed on Precedex.  Has been off of Precedex for more than 24 hours.  Currently on Librium taper.  Also on thiamine and multivitamins.  Seems to be improving.  Start mobilizing.  Was also on clonidine which has been tapered off.  Also noted to be on Depakote for 3 days.  Acute alcoholic pancreatitis  Pancreatitis secondary to alcoholism.  Lipase was initially elevated at 1487.  Has improved.  CT of the abdomen pelvis also consistent with acute pancreatitis.  Gastroenterology was consulted.  They have now signed off.  Patient seems to be improving.  Tolerating her diet.  Has not had a bowel movement  yet.  She is noted to be on laxatives which will be continued.  May need to increase the dose. LFTs have been improving.  Macrocytic anemia/transient thrombocytopenia/leukopenia Hemoglobin is stable.  No evidence for acute blood loss.  Platelet count is back to normal.  Electrolyte abnormalities with hypokalemia and hypomagnesemia These have been corrected.  Alcohol abuse According to family she has a long standing history of alcohol abuse.  Counseling has been done.  Suspected multiple sclerosis Apparently undergoing work-up for possible MS.  Outpatient follow-up.  Essential hypertension Lisinopril being continued.  Monitor blood pressures closely.  Oxygen requirements. Noted to be on 2 to 3 L of oxygen. saturations noted to be 98-100%.  Patient without any respiratory symptoms.  Discussed with nursing staff.  They will wean off.  Incentive spirometer.   DVT Prophylaxis: Lovenox Code Status: Full code Family Communication: Discussed with the patient.  No family at bedside Disposition Plan: Hopefully return home when improved.  Start mobilizing  Status is: Inpatient  Remains inpatient appropriate because:IV treatments appropriate due to intensity of illness or inability to take PO  Dispo: The patient is from: Home              Anticipated d/c is to: Home              Patient currently is not medically stable to d/c.   Difficult to place patient No    Medications: Scheduled:  brimonidine  1 drop Both Eyes Daily   chlordiazePOXIDE  25 mg Oral Daily   Chlorhexidine Gluconate Cloth  6 each Topical Daily   divalproex  250 mg Oral  Q8H   enoxaparin (LOVENOX) injection  40 mg Subcutaneous Q24H   feeding supplement  1 Container Oral TID BM   lisinopril  10 mg Oral Daily   mouth rinse  15 mL Mouth Rinse BID   multivitamin with minerals  1 tablet Oral Daily   polyethylene glycol  17 g Oral Daily   potassium chloride  40 mEq Oral Once   thiamine injection  100 mg Intravenous  Daily   Continuous:  0.9 % NaCl with KCl 20 mEq / L 50 mL/hr at 09/26/20 0728   promethazine (PHENERGAN) injection (IM or IVPB) Stopped (09/20/20 0005)   YCX:KGYJEHUDJ, LORazepam, ondansetron **OR** ondansetron (ZOFRAN) IV, promethazine (PHENERGAN) injection (IM or IVPB)   Objective:  Vital Signs  Vitals:   09/26/20 0700 09/26/20 0745 09/26/20 0800 09/26/20 0900  BP: (!) 154/114 (!) 153/103 (!) 155/106 (!) 143/105  Pulse: 88 86 85 92  Resp:  18 (!) 23 18  Temp:      TempSrc:      SpO2: 100% 100% 100% 98%  Weight:      Height:        Intake/Output Summary (Last 24 hours) at 09/26/2020 0919 Last data filed at 09/26/2020 0728 Gross per 24 hour  Intake 1335.21 ml  Output 1500 ml  Net -164.79 ml   Filed Weights   09/19/20 0920  Weight: 61.2 kg    General appearance: Awake alert.  In no distress Resp: Clear to auscultation bilaterally.  Normal effort Cardio: S1-S2 is normal regular.  No S3-S4.  No rubs murmurs or bruit GI: Abdomen is soft.  Mildly tender but nondistended.  Bowel sounds present.  No masses organomegaly.   Extremities: No edema.  Full range of motion of lower extremities. Neurologic: Alert and oriented x3.  No focal neurological deficits.    Lab Results:  Data Reviewed: I have personally reviewed following labs and imaging studies  CBC: Recent Labs  Lab 09/19/20 0930 09/19/20 1934 09/22/20 0250 09/23/20 0358 09/24/20 0239 09/25/20 0641 09/26/20 0719  WBC 4.6   < > 3.6* 3.8* 4.3 2.9* 2.8*  NEUTROABS 3.9  --   --   --   --   --   --   HGB 13.4   < > 9.8* 9.5* 10.7* 9.9* 10.2*  HCT 40.1   < > 29.9* 29.3* 33.1* 31.1* 31.8*  MCV 99.0   < > 101.4* 103.5* 104.7* 106.5* 104.6*  PLT 147*   < > 112* 151 195 209 252   < > = values in this interval not displayed.    Basic Metabolic Panel: Recent Labs  Lab 09/22/20 0250 09/23/20 0358 09/24/20 0239 09/25/20 0641 09/26/20 0719  NA 137 134* 135 139 140  K 2.7* 3.1* 3.9 3.8 3.9  CL 99 102 99 102 106   CO2 30 24 25 24 25   GLUCOSE 128* 193* 196* 162* 166*  BUN <5* 5* <5* <5* <5*  CREATININE 0.42* 0.35* 0.55 0.49 0.46  CALCIUM 8.7* 8.0* 8.1* 8.6* 8.9  MG  --  0.9* 1.6* 2.2  --     GFR: Estimated Creatinine Clearance: 72.6 mL/min (by C-G formula based on SCr of 0.46 mg/dL).  Liver Function Tests: Recent Labs  Lab 09/20/20 0443 09/21/20 0539 09/22/20 0250 09/23/20 0358 09/26/20 0719  AST 70* 52* 53* 56* 46*  ALT 88* 60* 48* 46* 50*  ALKPHOS 73 62 68 87 116  BILITOT 3.3* 3.0* 2.5* 2.6* 1.1  PROT 6.8 6.0* 5.5* 5.2* 6.0*  ALBUMIN  4.1 3.5 3.0* 2.7* 2.9*    Recent Labs  Lab 09/19/20 0930 09/20/20 0443 09/21/20 0539  LIPASE 1,487* 431* 134*    Coagulation Profile: Recent Labs  Lab 09/23/20 1029  INR 1.0     Recent Results (from the past 240 hour(s))  Resp Panel by RT-PCR (Flu A&B, Covid) Nasopharyngeal Swab     Status: None   Collection Time: 09/19/20 11:23 AM   Specimen: Nasopharyngeal Swab; Nasopharyngeal(NP) swabs in vial transport medium  Result Value Ref Range Status   SARS Coronavirus 2 by RT PCR NEGATIVE NEGATIVE Final    Comment: (NOTE) SARS-CoV-2 target nucleic acids are NOT DETECTED.  The SARS-CoV-2 RNA is generally detectable in upper respiratory specimens during the acute phase of infection. The lowest concentration of SARS-CoV-2 viral copies this assay can detect is 138 copies/mL. A negative result does not preclude SARS-Cov-2 infection and should not be used as the sole basis for treatment or other patient management decisions. A negative result may occur with  improper specimen collection/handling, submission of specimen other than nasopharyngeal swab, presence of viral mutation(s) within the areas targeted by this assay, and inadequate number of viral copies(<138 copies/mL). A negative result must be combined with clinical observations, patient history, and epidemiological information. The expected result is Negative.  Fact Sheet for  Patients:  BloggerCourse.com  Fact Sheet for Healthcare Providers:  SeriousBroker.it  This test is no t yet approved or cleared by the [place] FDA and  has been authorized for detection and/or diagnosis of SARS-CoV-2 by FDA under an Emergency Use Authorization (EUA). This EUA will remain  in effect (meaning this test can be used) for the duration of the COVID-19 declaration under Section 564(b)(1) of the Act, 21 U.S.C.section 360bbb-3(b)(1), unless the authorization is terminated  or revoked sooner.       Influenza A by PCR NEGATIVE NEGATIVE Final   Influenza B by PCR NEGATIVE NEGATIVE Final    Comment: (NOTE) The Xpert Xpress SARS-CoV-2/FLU/RSV plus assay is intended as an aid in the diagnosis of influenza from Nasopharyngeal swab specimens and should not be used as a sole basis for treatment. Nasal washings and aspirates are unacceptable for Xpert Xpress SARS-CoV-2/FLU/RSV testing.  Fact Sheet for Patients: BloggerCourse.com  Fact Sheet for Healthcare Providers: SeriousBroker.it  This test is not yet approved or cleared by the [place] FDA and has been authorized for detection and/or diagnosis of SARS-CoV-2 by FDA under an Emergency Use Authorization (EUA). This EUA will remain in effect (meaning this test can be used) for the duration of the COVID-19 declaration under Section 564(b)(1) of the Act, 21 U.S.C. section 360bbb-3(b)(1), unless the authorization is terminated or revoked.  Performed at [facility], [address], [place], [place] [address]       Radiology Studies: No results found.     LOS: 7 days   Bellatrix Devonshire [employer] on [email or web address]  09/26/2020, 9:19 AM

## 2020-09-26 NOTE — Progress Notes (Signed)
Nutrition Follow-up  DOCUMENTATION CODES:   Not applicable  INTERVENTION:  - continue Boost Breeze TID. - consider more aggressive bowel regimen.  - weigh patient today.   NUTRITION DIAGNOSIS:   Unintentional weight loss related to inability to eat, nausea as evidenced by per patient/family report, percent weight loss (11.9% x 3 months). -improving  GOAL:   Patient will meet greater than or equal to 90% of their needs -unmet  MONITOR:   PO intake, Supplement acceptance, Labs  ASSESSMENT:   50 y.o. female presented to ED with multiple days of dry heaving, shaking/tremors, and abdominal pain. Pt reports GI symptoms have been ongoing for >6 months, but pain started acutely over the last few days. PMH relevant for HTN  Diet advanced to CLD on 7/16, to FLD on 7/17, to Soft on 7/19, and to Regular yesterday at 0905.  Patient sleeping at this time with husband and sister at bedside. They provide all information.  She had a normal appetite until she began to feel unwell ~2 months ago. She does have chronic mild constipation, but she has currently been without a BM x10 days which is not normal for her. She has denied any abdominal pain/pressure when sister has asked her about this.  She has had a decreased appetite for the past 2 months. Family reports she is currently eating 1/3-1/2 of each meal. Example given of breakfast this AM at which she ate 100% oatmeal and fruit, but did not touch the yogurt. She has been eating 100% of all fruit that she receives.   Review of order indicates that she has been accepting Boost Breeze nearly 100% of the time offered.   Family reports that they have been informed for the past 3 days that PT would be by to work with patient, but that PT has not yet come.   She has not been weighed since admission on 7/15.   Labs reviewed; BUN: <5 mg/dl, LFTs stable since 4/27. Medications reviewed; 1 tablet multivitamin with minerals/day, 17 g miralax BID  started today, 40 mEq Klor-Con x1 dose 7/20, 100 mg IV thiamine/day.  IVF; NS-20 mEq IV KCl @ 50 ml/hr.    NUTRITION - FOCUSED PHYSICAL EXAM:  Unable to complete for patient who is sleeping.  Diet Order:   Diet Order             Diet regular Room service appropriate? Yes; Fluid consistency: Thin  Diet effective now                   EDUCATION NEEDS:   No education needs have been identified at this time  Skin:  Skin Assessment: Reviewed RN Assessment  Last BM:  7/12 per RN documentation  Height:   Ht Readings from Last 1 Encounters:  09/19/20 5\' 4"  (1.626 m)    Weight:   Wt Readings from Last 1 Encounters:  09/19/20 61.2 kg     Estimated Nutritional Needs:  Kcal:  1600-1800 kcal/d Protein:  80-100g/d Fluid:  >1.8L/d       09/21/20, MS, RD, LDN, CNSC Inpatient Clinical Dietitian RD pager # available in AMION  After hours/weekend pager # available in [facility]

## 2020-09-26 NOTE — Evaluation (Signed)
Physical Therapy Evaluation Patient Details Name: Hannah Farmer MRN: [id number] DOB: 1970/09/20 Today's Date: 09/26/2020   History of Present Illness  50 year old female with PMHx significant for HTN, hypercholesterolemia, nonspecific tremors (undergoing workup for multiple sclerosis) and history of heavy alcohol use who presented to [facility] 7/15 with abdominal pain.  She was found to have acute pancreatitis.  Pt also with alcohol withdrawal syndrome with delirium tremens and required transfer to ICU.  Clinical Impression  Pt admitted with above diagnosis. Pt currently with functional limitations due to the deficits listed below (see PT Problem List). Pt will benefit from skilled PT to increase their independence and safety with mobility to allow discharge to the venue listed below.   Pt eager to ambulate.   Pt denies any symptoms with ambulating and tolerated 400 feet with RW.  Pt encouraged to ambulate again with staff (nurse tech aware to use gait belt especially if not using RW).  Pt declines RW upon d/c at this time.       Follow Up Recommendations No PT follow up    Equipment Recommendations  Other (comment) (pt declines RW at this time)    Recommendations for Other Services       Precautions / Restrictions Precautions Precautions: Fall      Mobility  Bed Mobility Overal bed mobility: Needs Assistance Bed Mobility: Supine to Sit     Supine to sit: Min assist     General bed mobility comments: assist for LEs onto bed    Transfers Overall transfer level: Needs assistance Equipment used: Rolling walker (2 wheeled) Transfers: Sit to/from Stand Sit to Stand: Min guard         General transfer comment: cues for hand placement  Ambulation/Gait Ambulation/Gait assistance: Min guard Gait Distance (Feet): 400 Feet Assistive device: Rolling walker (2 wheeled) Gait Pattern/deviations: Step-through pattern;Decreased stride length     General Gait Details: slow pace  but steady with RW, SpO2 and HR WFL, short steps, able to walk approx 8 feet in room back to bed without RW min/guard  Stairs            Wheelchair Mobility    Modified Rankin (Stroke Patients Only)       Balance Overall balance assessment: Mild deficits observed, not formally tested                                           Pertinent Vitals/Pain Pain Assessment: Faces Faces Pain Scale: Hurts little more Pain Location: back Pain Descriptors / Indicators: Sore;Aching Pain Intervention(s): Repositioned;Monitored during session    Home Living Family/patient expects to be discharged to:: Private residence Living Arrangements: Spouse/significant other;Children Available Help at Discharge: Family Type of Home: House Home Access: Stairs to enter   Secretary/administrator of Steps: 2-3 Home Layout: Able to live on main level with bedroom/bathroom Home Equipment: None      Prior Function Level of Independence: Independent         Comments: works as a Research officer, political party        Lower Extremity Assessment Lower Extremity Assessment: Overall WFL for tasks assessed    Cervical / Trunk Assessment Cervical / Trunk Assessment: Normal  Communication   Communication: No difficulties  Cognition Arousal/Alertness: Awake/alert Behavior During Therapy: WFL for tasks assessed/performed Overall Cognitive Status: Within  Functional Limits for tasks assessed                                        General Comments      Exercises     Assessment/Plan    PT Assessment Patient needs continued PT services  PT Problem List Decreased activity tolerance;Decreased strength;Decreased mobility;Decreased knowledge of use of DME       PT Treatment Interventions Gait training;DME instruction;Therapeutic exercise;Functional mobility training;Therapeutic activities;Patient/family education;Balance  training;Stair training    PT Goals (Current goals can be found in the Care Plan section)  Acute Rehab PT Goals PT Goal Formulation: With patient Time For Goal Achievement: 10/10/20 Potential to Achieve Goals: Good    Frequency Min 3X/week   Barriers to discharge        Co-evaluation               AM-PAC PT "6 Clicks" Mobility  Outcome Measure Help needed turning from your back to your side while in a flat bed without using bedrails?: A Little Help needed moving from lying on your back to sitting on the side of a flat bed without using bedrails?: A Little Help needed moving to and from a bed to a chair (including a wheelchair)?: A Little Help needed standing up from a chair using your arms (e.g., wheelchair or bedside chair)?: A Little Help needed to walk in hospital room?: A Little Help needed climbing 3-5 steps with a railing? : A Little 6 Click Score: 18    End of Session   Activity Tolerance: Patient tolerated treatment well Patient left: in bed;with call bell/phone within reach;with family/visitor present Nurse Communication: Mobility status PT Visit Diagnosis: Difficulty in walking, not elsewhere classified (R26.2)    Time: 7793-9030 PT Time Calculation (min) (ACUTE ONLY): 21 min   Charges:   PT Evaluation $PT Eval Low Complexity: 1 Low     Kati PT, DPT Acute Rehabilitation Services Pager: [phone] Office: [phone]   Sarajane Jews 09/26/2020, 2:48 PM

## 2020-09-27 LAB — CBC
HCT: 33.1 % — ABNORMAL LOW (ref 36.0–46.0)
Hemoglobin: 10.7 g/dL — ABNORMAL LOW (ref 12.0–15.0)
MCH: 33.6 pg (ref 26.0–34.0)
MCHC: 32.3 g/dL (ref 30.0–36.0)
MCV: 104.1 fL — ABNORMAL HIGH (ref 80.0–100.0)
Platelets: 308 10*3/uL (ref 150–400)
RBC: 3.18 MIL/uL — ABNORMAL LOW (ref 3.87–5.11)
RDW: 15.1 % (ref 11.5–15.5)
WBC: 2.9 10*3/uL — ABNORMAL LOW (ref 4.0–10.5)
nRBC: 0 % (ref 0.0–0.2)

## 2020-09-27 LAB — BASIC METABOLIC PANEL
Anion gap: 9 (ref 5–15)
BUN: <5 mg/dL — ABNORMAL LOW (ref 6–20)
CO2: 25 mmol/L (ref 22–32)
Calcium: 9.1 mg/dL (ref 8.9–10.3)
Chloride: 105 mmol/L (ref 98–111)
Creatinine, Ser: 0.55 mg/dL (ref 0.44–1.00)
GFR, Estimated: >60 mL/min (ref >60)
Glucose, Bld: 165 mg/dL — ABNORMAL HIGH (ref 70–99)
Potassium: 3.8 mmol/L (ref 3.5–5.1)
Sodium: 139 mmol/L (ref 135–145)

## 2020-09-27 LAB — MAGNESIUM: Magnesium: 1.7 mg/dL (ref 1.7–2.4)

## 2020-09-27 MED ORDER — SENNOSIDES-DOCUSATE SODIUM 8.6-50 MG PO TABS
2.0000 | ORAL_TABLET | Freq: Two times a day (BID) | ORAL | Status: DC
  Administered 2020-09-27 – 2020-09-28 (×3): 2 via ORAL
  Filled 2020-09-27 (×3): qty 2

## 2020-09-27 NOTE — Progress Notes (Signed)
[facility] PROGRESS NOTE   Hannah Farmer ZGY:[id number] DOB: 10/04/1970 DOA: 09/19/2020  PCP: Arvilla Market, MD  Brief History/Interval Summary: 50 year old female with PMHx significant for HTN, hypercholesterolemia, nonspecific tremors (undergoing workup for multiple sclerosis) and history of heavy alcohol use who presented to [facility] 7/15 with abdominal pain.  She was found to have acute pancreatitis.  She was hospitalized and gastroenterology was consulted.  Subsequently she went into severe alcohol withdrawal requiring transfer to intensive care unit.  By critical care medicine and was placed on Precedex.  Stabilized and then transferred back to medicine service  Consultants: Seen by gastroenterology and critical care medicine both of whom have signed off  Procedures: None  Antibiotics: Anti-infectives (From admission, onward)    None       Subjective/Interval History: Patient mentions that she is feeling better.  Still has not had a bowel movement.  However she did ambulate quite a bit yesterday.  Feels stronger.  Her sister is at the bedside.     Assessment/Plan:  Alcohol withdrawal syndrome with delirium tremens Symptoms were severe that she had to be transferred to the intensive care unit.  She was seen by critical care medicine and placed on Precedex.  Patient was weaned off of Precedex.  Was also placed on Librium taper and Depakote.  These are being weaned off.  She was also on clonidine which has been tapered off. No significant signs or symptoms of withdrawal noted at this time.  Acute alcoholic pancreatitis  Pancreatitis secondary to alcoholism.  Lipase was initially elevated at 1487.  CT of the abdomen pelvis also consistent with acute pancreatitis.  Gastroenterology was consulted.  They have now signed off.   Patient has significantly improved.  She is tolerating a regular diet.  Still has not had a bowel movement.  We will increase the dose of  her laxatives.  LFTs are stable.    Macrocytic anemia/transient thrombocytopenia/leukopenia Hemoglobin is stable.  No evidence for acute blood loss.  Platelet count is back to normal.  Electrolyte abnormalities with hypokalemia and hypomagnesemia These have been corrected.  Alcohol abuse According to family she has a long standing history of alcohol abuse.  Counseling has been done.  She will be going to inpatient detox program at [facility] next week.  Patient was also seen by psychiatry and does not require inpatient psych admission.  Suspected multiple sclerosis Apparently undergoing work-up for possible MS.  Outpatient follow-up.  Essential hypertension Lisinopril being continued.  Occasional high readings noted.  This could be partly due to her withdrawal symptoms.  Could consider increasing the dose of lisinopril depending on blood pressure trends over the next 24 hours.  Transient oxygen requirements She was on 2 to 3 L of oxygen. saturations noted to be 98-100%.  Started on incentive spirometer.  Mobilize.  Now off of oxygen.     DVT Prophylaxis: Lovenox Code Status: Full code Family Communication: Discussed with the patient.  No family at bedside Disposition Plan: Hopefully return home when improved.   Status is: Inpatient  Remains inpatient appropriate because:IV treatments appropriate due to intensity of illness or inability to take PO  Dispo: The patient is from: Home              Anticipated d/c is to: Home              Patient currently is not medically stable to d/c.   Difficult to place patient No    Medications: Scheduled:  brimonidine  1 drop Both Eyes Daily   Chlorhexidine Gluconate Cloth  6 each Topical Daily   enoxaparin (LOVENOX) injection  40 mg Subcutaneous Q24H   feeding supplement  1 Container Oral TID BM   lisinopril  10 mg Oral Daily   mouth rinse  15 mL Mouth Rinse BID   melatonin  5 mg Oral Once   multivitamin with minerals  1 tablet  Oral Daily   polyethylene glycol  17 g Oral BID   potassium chloride  40 mEq Oral Once   senna-docusate  2 tablet Oral BID   thiamine injection  100 mg Intravenous Daily   Continuous:  promethazine (PHENERGAN) injection (IM or IVPB) Stopped (09/20/20 0005)   ZGY:FVCBSWHQP, LORazepam, ondansetron **OR** ondansetron (ZOFRAN) IV, promethazine (PHENERGAN) injection (IM or IVPB)   Objective:  Vital Signs  Vitals:   09/27/20 0700 09/27/20 0730 09/27/20 0800 09/27/20 0900  BP:   (!) 152/105 (!) 133/107  Pulse: (!) 112  93   Resp: 20  20 18   Temp:  99 F (37.2 C)    TempSrc:  Oral    SpO2: 100%  97%   Weight:      Height:        Intake/Output Summary (Last 24 hours) at 09/27/2020 0912 Last data filed at 09/27/2020 0824 Gross per 24 hour  Intake 1471.68 ml  Output 300 ml  Net 1171.68 ml    Filed Weights   09/19/20 0920 09/26/20 1600  Weight: 61.2 kg 65.6 kg    General appearance: Awake alert.  In no distress Resp: Clear to auscultation bilaterally.  Normal effort Cardio: S1-S2 is normal regular.  No S3-S4.  No rubs murmurs or bruit GI: Abdomen is soft.  Nontender nondistended.  Bowel sounds are present normal.  No masses organomegaly Extremities: No edema.  Full range of motion of lower extremities. Neurologic: Alert and oriented x3.  No focal neurological deficits.    Lab Results:  Data Reviewed: I have personally reviewed following labs and imaging studies  CBC: Recent Labs  Lab 09/23/20 0358 09/24/20 0239 09/25/20 0641 09/26/20 0719 09/27/20 0609  WBC 3.8* 4.3 2.9* 2.8* 2.9*  HGB 9.5* 10.7* 9.9* 10.2* 10.7*  HCT 29.3* 33.1* 31.1* 31.8* 33.1*  MCV 103.5* 104.7* 106.5* 104.6* 104.1*  PLT 151 195 209 252 308     Basic Metabolic Panel: Recent Labs  Lab 09/23/20 0358 09/24/20 0239 09/25/20 0641 09/26/20 0719 09/27/20 0609  NA 134* 135 139 140 139  K 3.1* 3.9 3.8 3.9 3.8  CL 102 99 102 106 105  CO2 24 25 24 25 25   GLUCOSE 193* 196* 162* 166* 165*   BUN 5* <5* <5* <5* <5*  CREATININE 0.35* 0.55 0.49 0.46 0.55  CALCIUM 8.0* 8.1* 8.6* 8.9 9.1  MG 0.9* 1.6* 2.2  --  1.7     GFR: Estimated Creatinine Clearance: 72.6 mL/min (by C-G formula based on SCr of 0.55 mg/dL).  Liver Function Tests: Recent Labs  Lab 09/21/20 0539 09/22/20 0250 09/23/20 0358 09/26/20 0719  AST 52* 53* 56* 46*  ALT 60* 48* 46* 50*  ALKPHOS 62 68 87 116  BILITOT 3.0* 2.5* 2.6* 1.1  PROT 6.0* 5.5* 5.2* 6.0*  ALBUMIN 3.5 3.0* 2.7* 2.9*     Recent Labs  Lab 09/21/20 0539  LIPASE 134*     Coagulation Profile: Recent Labs  Lab 09/23/20 1029  INR 1.0      Recent Results (from the past 240 hour(s))  Resp Panel by  RT-PCR (Flu A&B, Covid) Nasopharyngeal Swab     Status: None   Collection Time: 09/19/20 11:23 AM   Specimen: Nasopharyngeal Swab; Nasopharyngeal(NP) swabs in vial transport medium  Result Value Ref Range Status   SARS Coronavirus 2 by RT PCR NEGATIVE NEGATIVE Final    Comment: (NOTE) SARS-CoV-2 target nucleic acids are NOT DETECTED.  The SARS-CoV-2 RNA is generally detectable in upper respiratory specimens during the acute phase of infection. The lowest concentration of SARS-CoV-2 viral copies this assay can detect is 138 copies/mL. A negative result does not preclude SARS-Cov-2 infection and should not be used as the sole basis for treatment or other patient management decisions. A negative result may occur with  improper specimen collection/handling, submission of specimen other than nasopharyngeal swab, presence of viral mutation(s) within the areas targeted by this assay, and inadequate number of viral copies(<138 copies/mL). A negative result must be combined with clinical observations, patient history, and epidemiological information. The expected result is Negative.  Fact Sheet for Patients:  BloggerCourse.com  Fact Sheet for Healthcare Providers:   SeriousBroker.it  This test is no t yet approved or cleared by the [place] FDA and  has been authorized for detection and/or diagnosis of SARS-CoV-2 by FDA under an Emergency Use Authorization (EUA). This EUA will remain  in effect (meaning this test can be used) for the duration of the COVID-19 declaration under Section 564(b)(1) of the Act, 21 U.S.C.section 360bbb-3(b)(1), unless the authorization is terminated  or revoked sooner.       Influenza A by PCR NEGATIVE NEGATIVE Final   Influenza B by PCR NEGATIVE NEGATIVE Final    Comment: (NOTE) The Xpert Xpress SARS-CoV-2/FLU/RSV plus assay is intended as an aid in the diagnosis of influenza from Nasopharyngeal swab specimens and should not be used as a sole basis for treatment. Nasal washings and aspirates are unacceptable for Xpert Xpress SARS-CoV-2/FLU/RSV testing.  Fact Sheet for Patients: BloggerCourse.com  Fact Sheet for Healthcare Providers: SeriousBroker.it  This test is not yet approved or cleared by the [place] FDA and has been authorized for detection and/or diagnosis of SARS-CoV-2 by FDA under an Emergency Use Authorization (EUA). This EUA will remain in effect (meaning this test can be used) for the duration of the COVID-19 declaration under Section 564(b)(1) of the Act, 21 U.S.C. section 360bbb-3(b)(1), unless the authorization is terminated or revoked.  Performed at [facility], [address] [place], [place] [address]   MRSA Next Gen by PCR, Nasal     Status: None   Collection Time: 09/26/20 11:34 AM   Specimen: Nasal Mucosa; Nasal Swab  Result Value Ref Range Status   MRSA by PCR Next Gen NOT DETECTED NOT DETECTED Final    Comment: (NOTE) The GeneXpert MRSA Assay (FDA approved for NASAL specimens only), is one component of a comprehensive MRSA colonization surveillance program. It is not intended  to diagnose MRSA infection nor to guide or monitor treatment for MRSA infections. Test performance is not FDA approved in patients less than 77 years old. Performed at [facility], [address] [place], [place] [address]        Radiology Studies: No results found.     LOS: 8 days   Analysia Dungee [employer] on [email or web address]  09/27/2020, 9:12 AM

## 2020-09-27 NOTE — Progress Notes (Signed)
Report was called to 3W RN. All questions answered at this time. All pt belongings and paper chart transported with pt. Pt was transferred in the wheelchair by RN and NT. 3W will continue to care for pt.

## 2020-09-27 NOTE — Progress Notes (Signed)
A consult was placed to the IV Therapist for a new iv site;  pt requesting that she "not be stuck again 'cause I'm probably going home tomorrow;"  pt taking fluids and some solids; her sister is at the bedside also requesting the iv restart be held.  Spoke with Judeth Cornfield, RN who is caring for the pt;  will hold the restart at this time.

## 2020-09-27 NOTE — Progress Notes (Signed)
RN attempted to call report to 3W but Diplomatic Services operational officer said RN is at lunch at this time. This RN will try again in 30 mins as requested.

## 2020-09-28 LAB — BASIC METABOLIC PANEL
Anion gap: 11 (ref 5–15)
BUN: 7 mg/dL (ref 6–20)
CO2: 24 mmol/L (ref 22–32)
Calcium: 9.5 mg/dL (ref 8.9–10.3)
Chloride: 103 mmol/L (ref 98–111)
Creatinine, Ser: 0.71 mg/dL (ref 0.44–1.00)
GFR, Estimated: >60 mL/min (ref >60)
Glucose, Bld: 181 mg/dL — ABNORMAL HIGH (ref 70–99)
Potassium: 4 mmol/L (ref 3.5–5.1)
Sodium: 138 mmol/L (ref 135–145)

## 2020-09-28 MED ORDER — SENNOSIDES-DOCUSATE SODIUM 8.6-50 MG PO TABS: 2.0000 | ORAL_TABLET | Freq: Every day | ORAL | 0 refills | Status: AC

## 2020-09-28 MED ORDER — THIAMINE HCL 100 MG PO TABS: 100.0000 mg | ORAL_TABLET | Freq: Every day | ORAL | 0 refills | Status: AC

## 2020-09-28 MED ORDER — ONDANSETRON HCL 4 MG PO TABS: 4.0000 mg | ORAL_TABLET | Freq: Four times a day (QID) | ORAL | 0 refills | Status: DC | PRN

## 2020-09-28 MED ORDER — THIAMINE HCL 100 MG PO TABS
100.0000 mg | ORAL_TABLET | Freq: Every day | ORAL | Status: DC
  Administered 2020-09-28: 100 mg via ORAL
  Filled 2020-09-28: qty 1

## 2020-09-28 MED ORDER — POLYETHYLENE GLYCOL 3350 17 G PO PACK: 17.0000 g | PACK | Freq: Two times a day (BID) | ORAL | 0 refills | Status: AC

## 2020-09-28 MED ORDER — ADULT MULTIVITAMIN W/MINERALS CH: 1.0000 | ORAL_TABLET | Freq: Every day | ORAL | 0 refills | Status: AC

## 2020-09-28 MED ORDER — LISINOPRIL 20 MG PO TABS
20.0000 mg | ORAL_TABLET | Freq: Every day | ORAL | Status: DC
  Administered 2020-09-28: 20 mg via ORAL
  Filled 2020-09-28: qty 1

## 2020-09-28 NOTE — Progress Notes (Signed)
The patient is alert and oriented and has been seen by her physician. The orders for discharge were written. Patient did not have an IV in at this time. Went over discharge instructions with patient and family. She is about to be discharged via wheelchair with all of her belongings.

## 2020-09-28 NOTE — Plan of Care (Signed)
  Problem: Education: Goal: Knowledge of General Education information will improve Description Including pain rating scale, medication(s)/side effects and non-pharmacologic comfort measures Outcome: Progressing   

## 2020-09-28 NOTE — Discharge Summary (Signed)
[facility]  Physician Discharge Summary   Patient ID: Hannah Farmer MRN: [id number] DOB/AGE: 07/17/1970 50 y.o.  Admit date: 09/19/2020 Discharge date: 09/28/2020    PCP: Arvilla Market, MD  DISCHARGE DIAGNOSES:  Acute alcoholic pancreatitis Alcohol withdrawal syndrome with delirium tremens Macrocytic anemia History of alcohol abuse Essential hypertension   RECOMMENDATIONS FOR OUTPATIENT FOLLOW UP: Outpatient follow-up with PCP Patient to go to [facility] next week for drug rehab     Home Health: None Equipment/Devices: None  CODE STATUS: Full code  DISCHARGE CONDITION: fair  Diet recommendation: Low-sodium  INITIAL HISTORY: 50 year old female with PMHx significant for HTN, hypercholesterolemia, nonspecific tremors (undergoing workup for multiple sclerosis) and history of heavy alcohol use who presented to [facility] 7/15 with abdominal pain.  She was found to have acute pancreatitis.  She was hospitalized and gastroenterology was consulted.  Subsequently she went into severe alcohol withdrawal requiring transfer to intensive care unit.  By critical care medicine and was placed on Precedex.  Stabilized and then transferred back to medicine service   Consultants: Seen by gastroenterology and critical care medicine both of whom have signed off   Procedures: None   HOSPITAL COURSE:   Alcohol withdrawal syndrome with delirium tremens Symptoms were severe that she had to be transferred to the intensive care unit.  She was seen by critical care medicine and placed on Precedex.  Patient was weaned off of Precedex.  Was also placed on Librium taper and Depakote.  These were also weaned off along with clonidine.  Symptoms of withdrawal have improved significantly.    Acute alcoholic pancreatitis Pancreatitis secondary to alcoholism.  Lipase was initially elevated at 1487.  CT of the abdomen pelvis also consistent with acute pancreatitis.  Gastroenterology  was consulted.  They have now signed off.   Patient has significantly improved.  She is tolerating a regular diet.    Macrocytic anemia/transient thrombocytopenia/leukopenia Hemoglobin is stable.  No evidence for acute blood loss.  Platelet count is back to normal.   Electrolyte abnormalities with hypokalemia and hypomagnesemia These have been corrected.  Alcohol abuse According to family she has a long standing history of alcohol abuse.  Counseling has been done.  She will be going to inpatient detox program at [facility] next week.  Patient was also seen by psychiatry and does not require inpatient psych admission.   Suspected multiple sclerosis Apparently undergoing work-up for possible MS.  Outpatient follow-up.  Essential hypertension Resume home medications.     Transient oxygen requirements She was on 2 to 3 L of oxygen. saturations noted to be 98-100%.  Started on incentive spirometer.  Mobilize.  Now off of oxygen.      Overall stable.  Patient has ambulated without difficulty.  Feels much better.  She has enough family support at home for the next 1 week.  Following which she is looking forward to going to [facility] for drug rehab.  Okay for discharge home today.  PERTINENT LABS:  The results of significant diagnostics from this hospitalization (including imaging, microbiology, ancillary and laboratory) are listed below for reference.    Microbiology: Recent Results (from the past 240 hour(s))  MRSA Next Gen by PCR, Nasal     Status: None   Collection Time: 09/26/20 11:34 AM   Specimen: Nasal Mucosa; Nasal Swab  Result Value Ref Range Status   MRSA by PCR Next Gen NOT DETECTED NOT DETECTED Final    Comment: (NOTE) The GeneXpert MRSA Assay (FDA approved for  NASAL specimens only), is one component of a comprehensive MRSA colonization surveillance program. It is not intended to diagnose MRSA infection nor to guide or monitor treatment for MRSA  infections. Test performance is not FDA approved in patients less than 2 years old. Performed at [facility], [address], [place], [place] [address]      Labs:  COVID-19 Labs   Lab Results  Component Value Date   SARSCOV2NAA NEGATIVE 09/19/2020      Basic Metabolic Panel: Recent Labs  Lab 09/23/20 0358 09/24/20 0239 09/25/20 0641 09/26/20 0719 09/27/20 0609 09/28/20 0318  NA 134* 135 139 140 139 138  K 3.1* 3.9 3.8 3.9 3.8 4.0  CL 102 99 102 106 105 103  CO2 24 25 24 25 25 24   GLUCOSE 193* 196* 162* 166* 165* 181*  BUN 5* <5* <5* <5* <5* 7  CREATININE 0.35* 0.55 0.49 0.46 0.55 0.71  CALCIUM 8.0* 8.1* 8.6* 8.9 9.1 9.5  MG 0.9* 1.6* 2.2  --  1.7  --    Liver Function Tests: Recent Labs  Lab 09/23/20 0358 09/26/20 0719  AST 56* 46*  ALT 46* 50*  ALKPHOS 87 116  BILITOT 2.6* 1.1  PROT 5.2* 6.0*  ALBUMIN 2.7* 2.9*    CBC: Recent Labs  Lab 09/23/20 0358 09/24/20 0239 09/25/20 0641 09/26/20 0719 09/27/20 0609  WBC 3.8* 4.3 2.9* 2.8* 2.9*  HGB 9.5* 10.7* 9.9* 10.2* 10.7*  HCT 29.3* 33.1* 31.1* 31.8* 33.1*  MCV 103.5* 104.7* 106.5* 104.6* 104.1*  PLT 151 195 209 252 308      IMAGING STUDIES CT ABDOMEN PELVIS W CONTRAST  Result Date: 09/19/2020 CLINICAL DATA:  Abdominal pain.  Pancreatitis suspected EXAM: CT ABDOMEN AND PELVIS WITH CONTRAST TECHNIQUE: Multidetector CT imaging of the abdomen and pelvis was performed using the standard protocol following bolus administration of intravenous contrast. CONTRAST:  09/21/2020 OMNIPAQUE IOHEXOL 300 MG/ML  SOLN COMPARISON:  01/20/2014 FINDINGS: Lower chest: Lung bases are clear. No effusions. Heart is normal size. Hepatobiliary: Severe diffuse fatty infiltration throughout the liver. No focal abnormality. Gallbladder unremarkable. Pancreas: Inflammation/stranding around the pancreas and continuing in the anterior pararenal spaces, left greater than right compatible with acute pancreatitis. No  ductal dilatation. No evidence of pancreatic necrosis. Spleen: No focal abnormality.  Normal size. Adrenals/Urinary Tract: No adrenal abnormality. No focal renal abnormality. No stones or hydronephrosis. Urinary bladder is unremarkable. Stomach/Bowel: Left colonic diverticulosis. No active diverticulitis. Stomach and small bowel unremarkable. Vascular/Lymphatic: No evidence of aneurysm or adenopathy. Reproductive: Prior hysterectomy.  No adnexal masses. Other: No free fluid or free air. Musculoskeletal: No acute bony abnormality. IMPRESSION: Stranding around the pancreas compatible with acute pancreatitis. Severe diffuse hepatic steatosis. Left colonic diverticulosis. Electronically Signed   By: 01/22/2014 M.D.   On: 09/19/2020 11:12   DG CHEST PORT 1 VIEW  Result Date: 09/23/2020 CLINICAL DATA:  wheezing EXAM: PORTABLE CHEST - 1 VIEW COMPARISON:  none FINDINGS: Moderate bilateral perihilar and infrahilar infiltrates or edema, with relative peripheral sparing. No definite effusion. Heart size and mediastinal contours are within normal limits. No pneumothorax. Visualized bones unremarkable. IMPRESSION: Bilateral perihilar edema or infiltrates without cardiomegaly. Electronically Signed   By: 09/25/2020 M.D.   On: 09/23/2020 11:57    DISCHARGE EXAMINATION: Vitals:   09/27/20 1656 09/27/20 2112 09/28/20 0135 09/28/20 0557  BP: (!) 155/107 (!) 157/116 (!) 159/117 138/89  Pulse: 90 100 (!) 101 97  Resp: 18 16 16 16   Temp: 98.3 F (36.8 C)  99 F (37.2 C) 98.4 F (36.9 C) 98.5 F (36.9 C)  TempSrc: Oral Oral Oral Oral  SpO2: 97% 95% 96% 97%  Weight:      Height:       General appearance: Awake alert.  In no distress Resp: Clear to auscultation bilaterally.  Normal effort Cardio: S1-S2 is normal regular.  No S3-S4.  No rubs murmurs or bruit GI: Abdomen is soft.  Nontender nondistended.  Bowel sounds are present normal.  No masses organomegaly    DISPOSITION: Home  Discharge Instructions      Call MD for:  extreme fatigue   Complete by: As directed    Call MD for:  persistant dizziness or light-headedness   Complete by: As directed    Call MD for:  persistant nausea and vomiting   Complete by: As directed    Call MD for:  severe uncontrolled pain   Complete by: As directed    Call MD for:  temperature >100.4   Complete by: As directed    Diet - low sodium heart healthy   Complete by: As directed    Discharge instructions   Complete by: As directed    Please take your medications as prescribed.  Please stop consuming alcoholic beverages.  Follow-up with your PCP.  You were cared for by a hospitalist during your hospital stay. If you have any questions about your discharge medications or the care you received while you were in the hospital after you are discharged, you can call the unit and asked to speak with the hospitalist on call if the hospitalist that took care of you is not available. Once you are discharged, your primary care physician will handle any further medical issues. Please note that NO REFILLS for any discharge medications will be authorized once you are discharged, as it is imperative that you return to your primary care physician (or establish a relationship with a primary care physician if you do not have one) for your aftercare needs so that they can reassess your need for medications and monitor your lab values. If you do not have a primary care physician, you can call [phone] for a physician referral.   Increase activity slowly   Complete by: As directed          Allergies as of 09/28/2020       Reactions   Codeine Rash        Medication List     STOP taking these medications    ondansetron 4 MG disintegrating tablet Commonly known as: ZOFRAN-ODT       TAKE these medications    lisinopril-hydrochlorothiazide 20-12.5 MG tablet Commonly known as: ZESTORETIC TAKE 1 TABLET BY MOUTH EVERY DAY   Lumify 0.025 % Soln Generic drug:  Brimonidine Tartrate Place 1 drop into both eyes daily.   multivitamin with minerals Tabs tablet Take 1 tablet by mouth daily.   ondansetron 4 MG tablet Commonly known as: ZOFRAN Take 1 tablet (4 mg total) by mouth every 6 (six) hours as needed for nausea.   polyethylene glycol 17 g packet Commonly known as: MIRALAX / GLYCOLAX Take 17 g by mouth 2 (two) times daily.   senna-docusate 8.6-50 MG tablet Commonly known as: Senokot-S Take 2 tablets by mouth at bedtime.   thiamine 100 MG tablet Take 1 tablet (100 mg total) by mouth daily.          Follow-up Information     Arvilla Market, MD Follow up in 1 week(s).  Specialty: Family Medicine Contact information: [address], [place] [address] [phone]                 TOTAL DISCHARGE TIME: 35 minutes  Calyse Murcia Rito EhrlichKrishnan  [facility] Pager on [email or web address]  09/29/2020, 12:39 PM

## 2020-09-29 ENCOUNTER — Telehealth: Payer: Self-pay

## 2020-09-29 NOTE — Telephone Encounter (Signed)
Transition Care Management Unsuccessful Follow-up Telephone Call  Date of discharge and from where:  09/28/2020, [facility]  Attempts:  1st Attempt  Reason for unsuccessful TCM follow-up call:  Left voice message on # [phone]. Need to confirm if patient will be following up at [facility] or has established care with new PCP

## 2020-09-30 ENCOUNTER — Telehealth: Payer: Self-pay

## 2020-09-30 NOTE — Telephone Encounter (Signed)
Transition Care Management Unsuccessful Follow-up Telephone Call  Date of discharge and from where:  09/28/2020, [facility]   Attempts:  2nd Attempt  Reason for unsuccessful TCM follow-up call:  Left voice message on # [phone].   Need to inquire if patient plans to follow up with [facility].  Dr Earlene Plater is listed as PCP but is no longer at [facility].  Patient only saw  Dr Earlene Plater once and it was a televisit.

## 2020-10-01 ENCOUNTER — Telehealth: Payer: Self-pay

## 2020-10-01 NOTE — Telephone Encounter (Signed)
Transition Care Management Follow-up Telephone Call Date of discharge and from where: 09/28/2020, [facility]  How have you been since you were released from the hospital? She said she is doing good. She has family staying with her and she plans to go to [facility] on Monday 10/06/2020. She also said that she has a new PCP - Dr Vivi Ferns who  she will be following up with . Dr Earlene Plater removed as PCP in Epic

## 2021-09-22 ENCOUNTER — Other Ambulatory Visit: Payer: Self-pay | Admitting: Nephrology

## 2021-09-22 ENCOUNTER — Other Ambulatory Visit: Payer: Self-pay | Admitting: Family Medicine

## 2021-09-22 DIAGNOSIS — Z1231 Encounter for screening mammogram for malignant neoplasm of breast: Secondary | ICD-10-CM

## 2021-09-29 ENCOUNTER — Ambulatory Visit: Payer: Self-pay

## 2021-09-30 ENCOUNTER — Ambulatory Visit
Admission: RE | Admit: 2021-09-30 | Discharge: 2021-09-30 | Disposition: A | Discharge status: Home / Self Care | Payer: Self-pay | Source: Ambulatory Visit | Attending: Family Medicine | Admitting: Family Medicine

## 2021-09-30 DIAGNOSIS — Z1231 Encounter for screening mammogram for malignant neoplasm of breast: Secondary | ICD-10-CM

## 2021-10-02 ENCOUNTER — Other Ambulatory Visit: Payer: Self-pay | Admitting: Family Medicine

## 2021-10-02 DIAGNOSIS — R928 Other abnormal and inconclusive findings on diagnostic imaging of breast: Secondary | ICD-10-CM

## 2021-10-09 ENCOUNTER — Ambulatory Visit
Admission: RE | Admit: 2021-10-09 | Discharge: 2021-10-09 | Disposition: A | Discharge status: Home / Self Care | Payer: Self-pay | Source: Ambulatory Visit | Attending: Family Medicine | Admitting: Family Medicine

## 2021-10-09 ENCOUNTER — Ambulatory Visit
Admission: RE | Admit: 2021-10-09 | Discharge: 2021-10-09 | Disposition: A | Discharge status: Home / Self Care | Payer: 59 | Source: Ambulatory Visit | Attending: Family Medicine | Admitting: Family Medicine

## 2021-10-09 DIAGNOSIS — R928 Other abnormal and inconclusive findings on diagnostic imaging of breast: Secondary | ICD-10-CM

## 2022-11-17 ENCOUNTER — Other Ambulatory Visit: Payer: Self-pay

## 2022-11-17 ENCOUNTER — Emergency Department (HOSPITAL_COMMUNITY)
Admission: EM | Admit: 2022-11-17 | Discharge: 2022-11-17 | Disposition: A | Discharge status: Home / Self Care | Payer: 59 | Attending: Emergency Medicine | Admitting: Emergency Medicine

## 2022-11-17 ENCOUNTER — Encounter (HOSPITAL_COMMUNITY): Payer: Self-pay

## 2022-11-17 ENCOUNTER — Emergency Department (HOSPITAL_COMMUNITY): Payer: 59

## 2022-11-17 DIAGNOSIS — K859 Acute pancreatitis without necrosis or infection, unspecified: Secondary | ICD-10-CM | POA: Diagnosis not present

## 2022-11-17 DIAGNOSIS — R109 Unspecified abdominal pain: Secondary | ICD-10-CM | POA: Diagnosis present

## 2022-11-17 DIAGNOSIS — N3000 Acute cystitis without hematuria: Secondary | ICD-10-CM | POA: Insufficient documentation

## 2022-11-17 LAB — I-STAT CHEM 8, ED
BUN: 33 mg/dL — ABNORMAL HIGH (ref 6–20)
Calcium, Ion: 1.24 mmol/L (ref 1.15–1.40)
Chloride: 101 mmol/L (ref 98–111)
Creatinine, Ser: 1 mg/dL (ref 0.44–1.00)
Glucose, Bld: 183 mg/dL — ABNORMAL HIGH (ref 70–99)
HCT: 48 % — ABNORMAL HIGH (ref 36.0–46.0)
Hemoglobin: 16.3 g/dL — ABNORMAL HIGH (ref 12.0–15.0)
Potassium: 4.3 mmol/L (ref 3.5–5.1)
Sodium: 132 mmol/L — ABNORMAL LOW (ref 135–145)
TCO2: 16 mmol/L — ABNORMAL LOW (ref 22–32)

## 2022-11-17 LAB — COMPREHENSIVE METABOLIC PANEL
ALT: 43 U/L (ref 0–44)
AST: 44 U/L — ABNORMAL HIGH (ref 15–41)
Albumin: 5.7 g/dL — ABNORMAL HIGH (ref 3.5–5.0)
Alkaline Phosphatase: 66 U/L (ref 38–126)
Anion gap: 19 — ABNORMAL HIGH (ref 5–15)
BUN: 34 mg/dL — ABNORMAL HIGH (ref 6–20)
CO2: 16 mmol/L — ABNORMAL LOW (ref 22–32)
Calcium: 9.4 mg/dL (ref 8.9–10.3)
Chloride: 94 mmol/L — ABNORMAL LOW (ref 98–111)
Creatinine, Ser: 1.16 mg/dL — ABNORMAL HIGH (ref 0.44–1.00)
GFR, Estimated: 57 mL/min — ABNORMAL LOW (ref >60)
Glucose, Bld: 183 mg/dL — ABNORMAL HIGH (ref 70–99)
Potassium: 4 mmol/L (ref 3.5–5.1)
Sodium: 129 mmol/L — ABNORMAL LOW (ref 135–145)
Total Bilirubin: 2 mg/dL — ABNORMAL HIGH (ref 0.3–1.2)
Total Protein: 9.4 g/dL — ABNORMAL HIGH (ref 6.5–8.1)

## 2022-11-17 LAB — URINALYSIS, ROUTINE W REFLEX MICROSCOPIC
Bilirubin Urine: NEGATIVE
Glucose, UA: NEGATIVE mg/dL
Ketones, ur: 80 mg/dL — AB
Nitrite: NEGATIVE
Protein, ur: >=300 mg/dL — AB
Specific Gravity, Urine: >1.046 — ABNORMAL HIGH (ref 1.005–1.030)
pH: 6 (ref 5.0–8.0)

## 2022-11-17 LAB — CBC WITH DIFFERENTIAL/PLATELET
Abs Immature Granulocytes: 0.05 10*3/uL (ref 0.00–0.07)
Basophils Absolute: 0 10*3/uL (ref 0.0–0.1)
Basophils Relative: 0 %
Eosinophils Absolute: 0 10*3/uL (ref 0.0–0.5)
Eosinophils Relative: 0 %
HCT: 44.2 % (ref 36.0–46.0)
Hemoglobin: 14.5 g/dL (ref 12.0–15.0)
Immature Granulocytes: 1 %
Lymphocytes Relative: 9 %
Lymphs Abs: 0.7 10*3/uL (ref 0.7–4.0)
MCH: 31.1 pg (ref 26.0–34.0)
MCHC: 32.8 g/dL (ref 30.0–36.0)
MCV: 94.8 fL (ref 80.0–100.0)
Monocytes Absolute: 0.6 10*3/uL (ref 0.1–1.0)
Monocytes Relative: 8 %
Neutro Abs: 6.1 10*3/uL (ref 1.7–7.7)
Neutrophils Relative %: 82 %
Platelets: 193 10*3/uL (ref 150–400)
RBC: 4.66 MIL/uL (ref 3.87–5.11)
RDW: 13.6 % (ref 11.5–15.5)
WBC: 7.5 10*3/uL (ref 4.0–10.5)
nRBC: 0 % (ref 0.0–0.2)

## 2022-11-17 LAB — LIPASE, BLOOD: Lipase: 133 U/L — ABNORMAL HIGH (ref 11–51)

## 2022-11-17 MED ORDER — CEPHALEXIN 500 MG PO CAPS
500.0000 mg | ORAL_CAPSULE | Freq: Four times a day (QID) | ORAL | 0 refills | Status: AC
Start: 2022-11-17 — End: 2022-11-24

## 2022-11-17 MED ORDER — LACTATED RINGERS IV BOLUS
2000.0000 mL | Freq: Once | INTRAVENOUS | Status: AC
  Administered 2022-11-17: 2000 mL via INTRAVENOUS

## 2022-11-17 MED ORDER — HYDROCODONE-ACETAMINOPHEN 5-325 MG PO TABS: 2.0000 | ORAL_TABLET | ORAL | 0 refills | Status: AC | PRN

## 2022-11-17 MED ORDER — HYDROCODONE-ACETAMINOPHEN 5-325 MG PO TABS: 1.0000 | ORAL_TABLET | Freq: Once | ORAL | Status: DC

## 2022-11-17 MED ORDER — ONDANSETRON 4 MG PO TBDP: 4.0000 mg | ORAL_TABLET | Freq: Three times a day (TID) | ORAL | 0 refills | Status: AC | PRN

## 2022-11-17 MED ORDER — SODIUM CHLORIDE (PF) 0.9 % IJ SOLN
INTRAMUSCULAR | Status: AC
  Filled 2022-11-17: qty 50

## 2022-11-17 MED ORDER — MORPHINE SULFATE (PF) 4 MG/ML IV SOLN
4.0000 mg | Freq: Once | INTRAVENOUS | Status: AC
  Administered 2022-11-17: 4 mg via INTRAVENOUS
  Filled 2022-11-17: qty 1

## 2022-11-17 MED ORDER — ONDANSETRON 4 MG PO TBDP: 4.0000 mg | ORAL_TABLET | Freq: Once | ORAL | Status: DC

## 2022-11-17 MED ORDER — ONDANSETRON HCL 4 MG/2ML IJ SOLN
4.0000 mg | Freq: Once | INTRAMUSCULAR | Status: AC
  Administered 2022-11-17: 4 mg via INTRAVENOUS
  Filled 2022-11-17: qty 2

## 2022-11-17 MED ORDER — IOHEXOL 300 MG/ML  SOLN
100.0000 mL | Freq: Once | INTRAMUSCULAR | Status: AC | PRN
  Administered 2022-11-17: 100 mL via INTRAVENOUS

## 2022-11-17 NOTE — Discharge Instructions (Signed)
Your workup today shows some signs of a pancreatitis flareup.  He had good pain control in the emergency department.  You are able to tolerate food and drink prior to your discharge.  If you have any concerning symptoms you return to the emergency room.  Pain medication and nausea medication sent into your pharmacy.  Have also sent an antibiotic to treat a urinary tract infection.  There are some signs of a potential infection.  Have sent a urine culture to confirm the infection we should have the results for this in 2 days.  Follow-up with your primary care provider within the next couple days for reevaluation.

## 2022-11-17 NOTE — ED Triage Notes (Signed)
Pt coming in today stating that she is having a pancreatitis flareup, pt has prior hx of dx. Pt endorses nausea/vomiting, abd pain, and believes that she is dehydrated.

## 2022-11-17 NOTE — ED Provider Notes (Signed)
[place] [facility] AT [facility] Provider Note   CSN: [id number] Arrival date & time: 11/17/22  0865     History  Chief Complaint  Patient presents with   Abdominal Pain   Emesis    Hannah Farmer is a 52 y.o. female.  52 year old female with past medical history significant for pancreatitis presents today for concern of abdominal pain, nausea and vomiting since Monday.  She states she has not been able to tolerate any p.o. intake since then.  States typically when her flareups occurred they typically improve after 24 hours but this 1 has persisted.  No dysuria, hematemesis, blood in stool.  Pain is epigastric and radiates into the back.  The history is provided by the patient. No language interpreter was used.       Home Medications Prior to Admission medications   Medication Sig Start Date End Date Taking? Authorizing Provider  Brimonidine Tartrate (LUMIFY) 0.025 % SOLN Place 1 drop into both eyes daily.    [provider]  lisinopril-hydrochlorothiazide (ZESTORETIC) 20-12.5 MG tablet TAKE 1 TABLET BY MOUTH EVERY DAY 11/05/19   Anyanwu, Jethro Bastos, MD  ondansetron (ZOFRAN) 4 MG tablet Take 1 tablet (4 mg total) by mouth every 6 (six) hours as needed for nausea. 09/28/20   Osvaldo Shipper, MD  polyethylene glycol (MIRALAX / GLYCOLAX) 17 g packet Take 17 g by mouth 2 (two) times daily. 09/28/20   Osvaldo Shipper, MD      Allergies    Codeine    Review of Systems   Review of Systems  Constitutional:  Negative for fever.  Gastrointestinal:  Positive for abdominal pain, nausea and vomiting. Negative for blood in stool.  Neurological:  Negative for light-headedness.  All other systems reviewed and are negative.   Physical Exam Updated Vital Signs BP (!) 134/93   Pulse 90   Temp 98.2 F (36.8 C) (Oral)   Resp 18   Ht 5\' 3"  (1.6 m)   Wt 61.2 kg   SpO2 100%   BMI 23.91 kg/m  Physical Exam Vitals and nursing note reviewed.   Constitutional:      General: She is not in acute distress.    Appearance: Normal appearance. She is not ill-appearing.  HENT:     Head: Normocephalic and atraumatic.     Nose: Nose normal.  Eyes:     Conjunctiva/sclera: Conjunctivae normal.  Cardiovascular:     Rate and Rhythm: Normal rate and regular rhythm.     Heart sounds: Normal heart sounds.  Pulmonary:     Effort: Pulmonary effort is normal. No respiratory distress.     Breath sounds: No wheezing.  Abdominal:     Palpations: Abdomen is soft.     Tenderness: There is abdominal tenderness. There is no guarding.  Musculoskeletal:        General: No deformity. Normal range of motion.  Skin:    Findings: No rash.  Neurological:     Mental Status: She is alert.     ED Results / Procedures / Treatments   Labs (all labs ordered are listed, but only abnormal results are displayed) Labs Reviewed  I-STAT CHEM 8, ED - Abnormal; Notable for the following components:      Result Value   Sodium 132 (*)    BUN 33 (*)    Glucose, Bld 183 (*)    TCO2 16 (*)    Hemoglobin 16.3 (*)    HCT 48.0 (*)    All  other components within normal limits  CBC WITH DIFFERENTIAL/PLATELET  COMPREHENSIVE METABOLIC PANEL  LIPASE, BLOOD  URINALYSIS, ROUTINE W REFLEX MICROSCOPIC    EKG None  Radiology No results found.  Procedures Procedures    Medications Ordered in ED Medications  morphine (PF) 4 MG/ML injection 4 mg (4 mg Intravenous Given 11/17/22 0905)  lactated ringers bolus 2,000 mL (2,000 mLs Intravenous New Bag/Given 11/17/22 0912)  ondansetron (ZOFRAN) injection 4 mg (4 mg Intravenous Given 11/17/22 0905)  iohexol (OMNIPAQUE) 300 MG/ML solution 100 mL (100 mLs Intravenous Contrast Given 11/17/22 0947)    ED Course/ Medical Decision Making/ A&P                                 Medical Decision Making Amount and/or Complexity of Data Reviewed Labs: ordered. Radiology: ordered.  Risk Prescription drug management.   37  female with past medical history significant for pancreatitis presents today for concern of epigastric abdominal pain radiating into the back.  Reports nausea and vomiting to the point she has not been able to tolerate p.o. intake since Monday.  She states this feels similar to her prior episodes of pancreatitis flareup.  No hematemesis.  Workup shows CBC without leukocytosis or anemia.  CMP shows sodium of 129, glucose of 193.  Mild renal insufficiency with creatinine of 1.16.  UA with moderate leukocytes, 21-50 WBCs.  Will send urine culture.  Lipase elevated at 133.  CT abdomen pelvis obtained which shows some signs of acute pancreatitis.  No other acute process.  Patient on reevaluation reports significant improvement in pain.  Will p.o. trial and reevaluate.  Tolerated p.o. challenge without difficulty.  Will discharge with p.o. pain medication.  Currently pain-free.  Will give p.o. medication prior to discharge until she can pick up the prescribed medications.  She is agreeable to discharge with symptomatic management at home.  Strict return precautions discussed.  Patient voices understanding and is in agreement with plan.  UA shows some signs of UTI.  She is asymptomatic but we will go ahead and treat and send urine culture.  Final Clinical Impression(s) / ED Diagnoses Final diagnoses:  Acute pancreatitis, unspecified complication status, unspecified pancreatitis type  Acute cystitis without hematuria    Rx / DC Orders ED Discharge Orders          Ordered    HYDROcodone-acetaminophen (NORCO/VICODIN) 5-325 MG tablet  Every 4 hours PRN        11/17/22 1449    ondansetron (ZOFRAN-ODT) 4 MG disintegrating tablet  Every 8 hours PRN        11/17/22 1449              Marita Kansas, PA-C 11/17/22 1449    Linwood Dibbles, MD 11/19/22 [phone]

## 2022-11-17 NOTE — ED Notes (Signed)
Pt PO challenge performed with a cup of water. Resulted in mild nausea and no vomiting. Water tolerated well.

## 2022-11-18 LAB — URINE CULTURE: Culture: NO GROWTH

## 2022-12-14 ENCOUNTER — Other Ambulatory Visit: Payer: Self-pay | Admitting: Family Medicine

## 2022-12-14 DIAGNOSIS — Z0000 Encounter for general adult medical examination without abnormal findings: Secondary | ICD-10-CM

## 2023-02-22 ENCOUNTER — Ambulatory Visit: Payer: 59

## 2023-03-03 ENCOUNTER — Ambulatory Visit
Admission: RE | Admit: 2023-03-03 | Discharge: 2023-03-03 | Disposition: A | Discharge status: Home / Self Care | Payer: 59 | Source: Ambulatory Visit | Attending: Family Medicine | Admitting: Family Medicine

## 2023-03-03 DIAGNOSIS — Z0000 Encounter for general adult medical examination without abnormal findings: Secondary | ICD-10-CM

## 2023-07-14 IMAGING — CT CT ABD-PELV W/ CM
2 of 5 series · 16 of 46 positions shown, 18 images · IV contrast (APPLIED)
Comparison: 01/20/2014

CLINICAL DATA: Abdominal pain.  Pancreatitis suspected

EXAM:
CT ABDOMEN AND PELVIS WITH CONTRAST
TECHNIQUE: Multidetector CT imaging of the abdomen and pelvis was performed
using the standard protocol following bolus administration of
intravenous contrast.
CONTRAST:  100mL OMNIPAQUE IOHEXOL 300 MG/ML  SOLN

[Series 2: abd pel w · axial · 0.65mm/px · z∈[+833,+1208]mm · 13 of 85 slices shown, 15 images]
[im 5/85  soft-tissue]
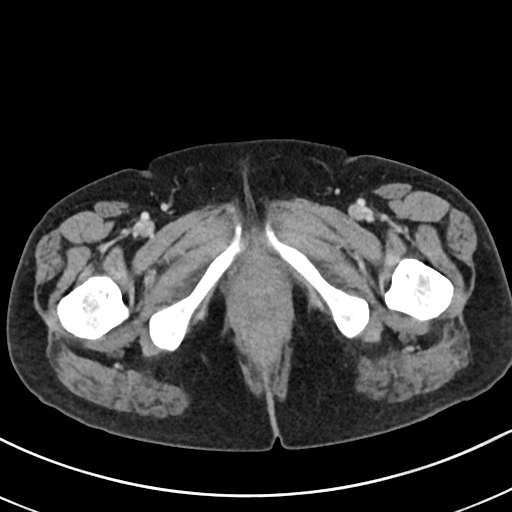
[im 5/85  bone]
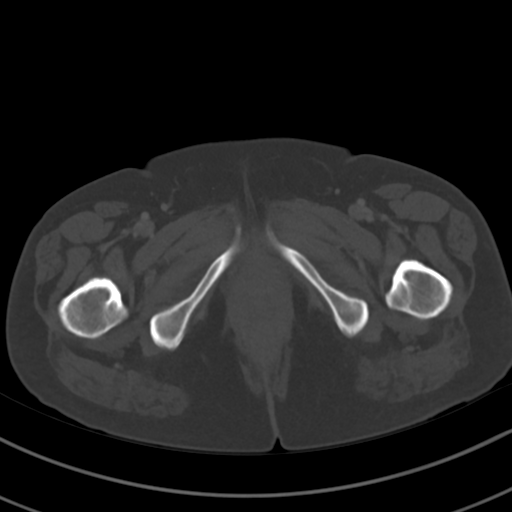
[im 10/85  soft-tissue]
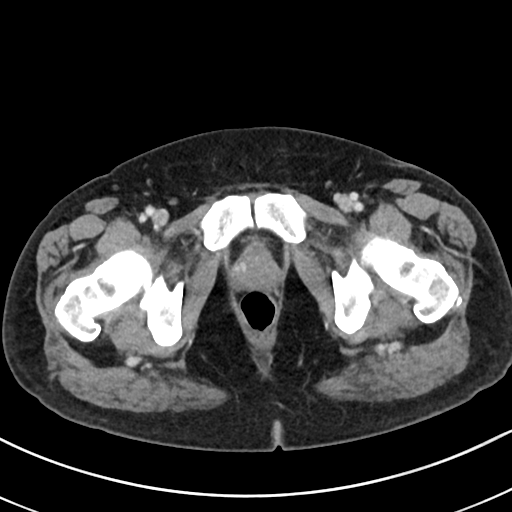
[im 19/85  soft-tissue]
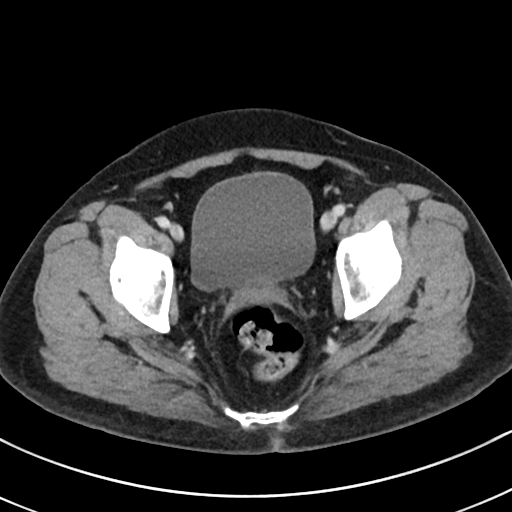
[im 24/85  soft-tissue]
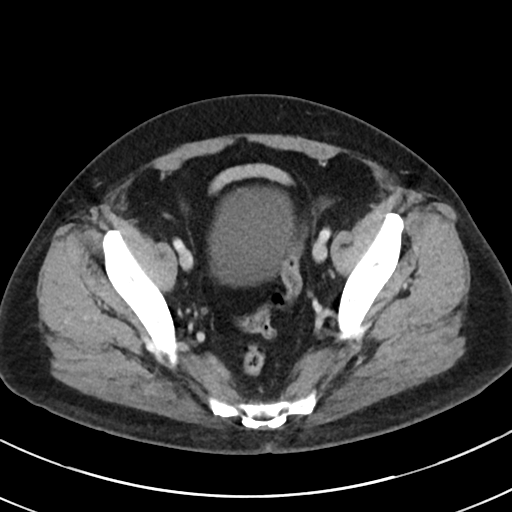
[im 29/85  soft-tissue]
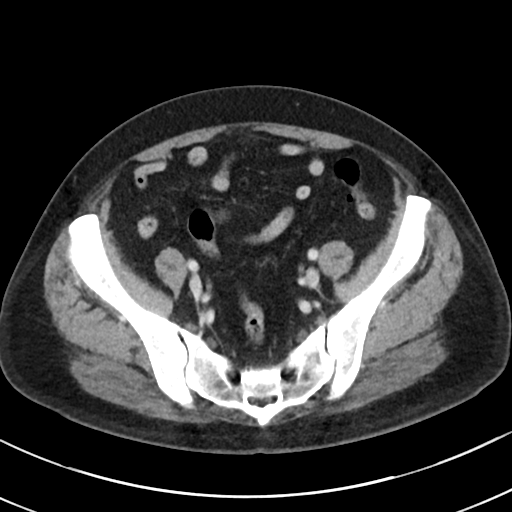
[im 38/85  soft-tissue]
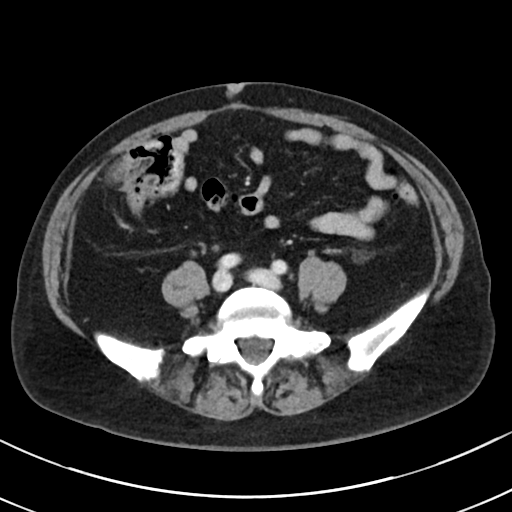
[im 43/85  soft-tissue]
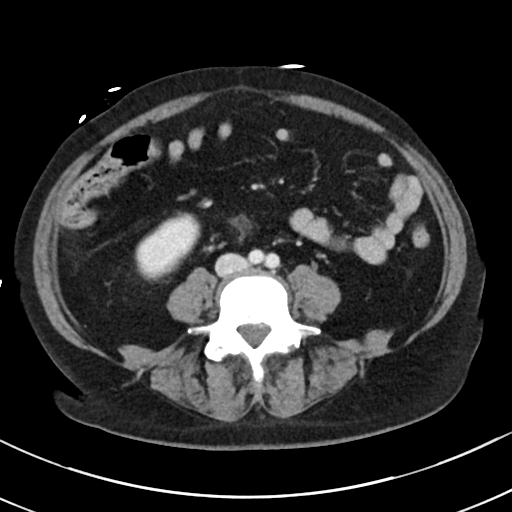
[im 47/85  soft-tissue]
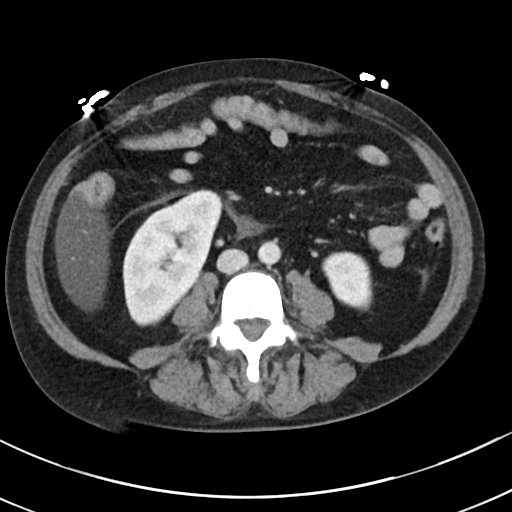
[im 57/85  soft-tissue]
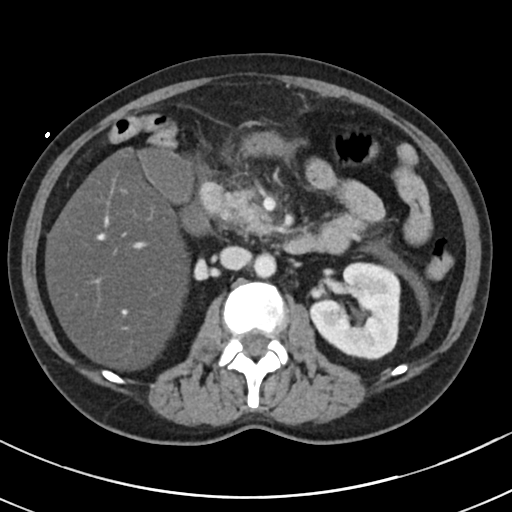
[im 57/85  bone]
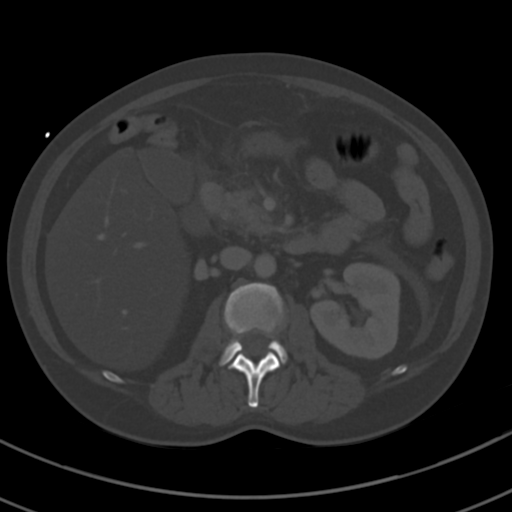
[im 61/85  soft-tissue]
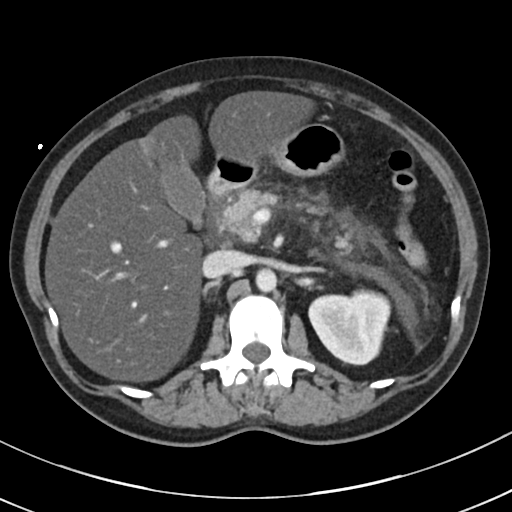
[im 66/85  soft-tissue]
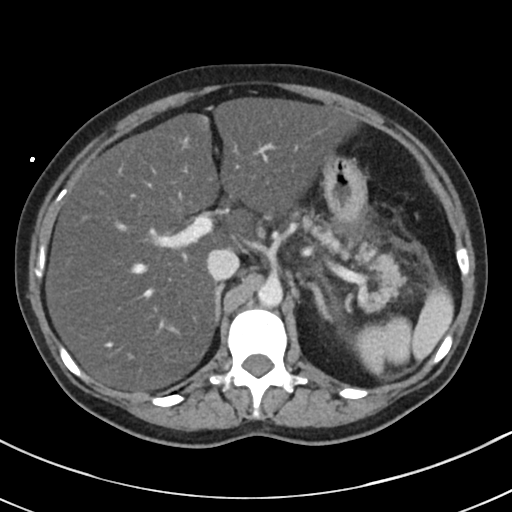
[im 75/85  soft-tissue]
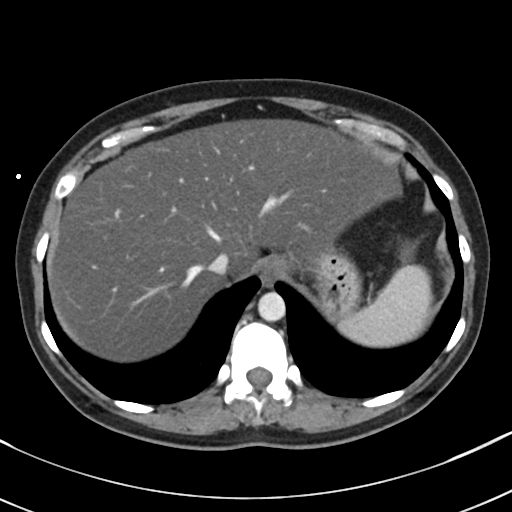
[im 80/85  soft-tissue]
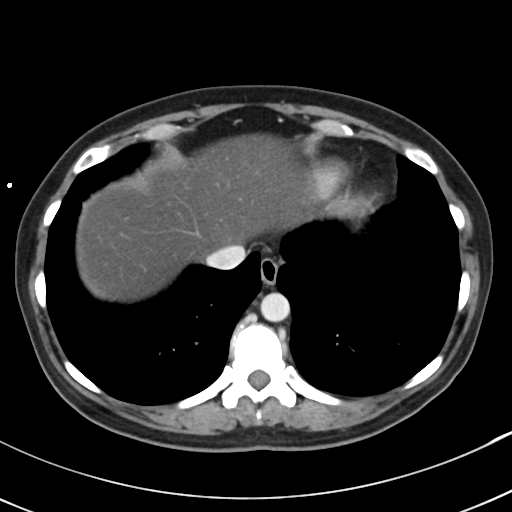

[Series 5: coronal · coronal · 0.72mm/px · 3 of 84 slices shown]
[im 28/84  soft-tissue]
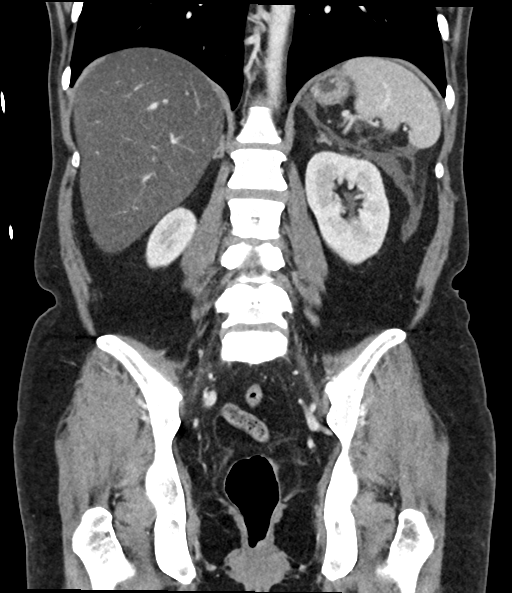
[im 37/84  soft-tissue]
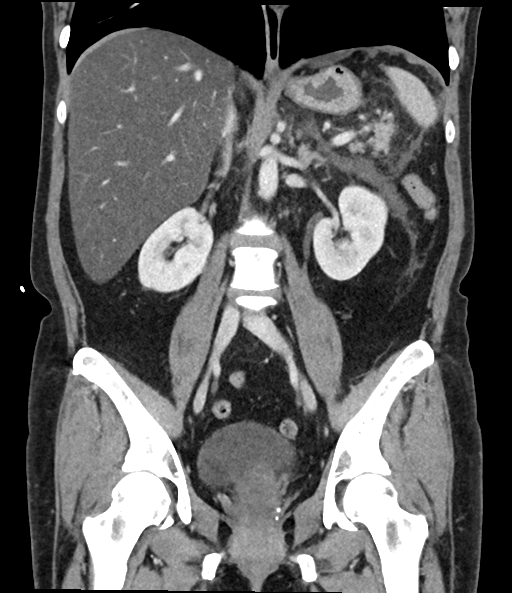
[im 47/84  soft-tissue]
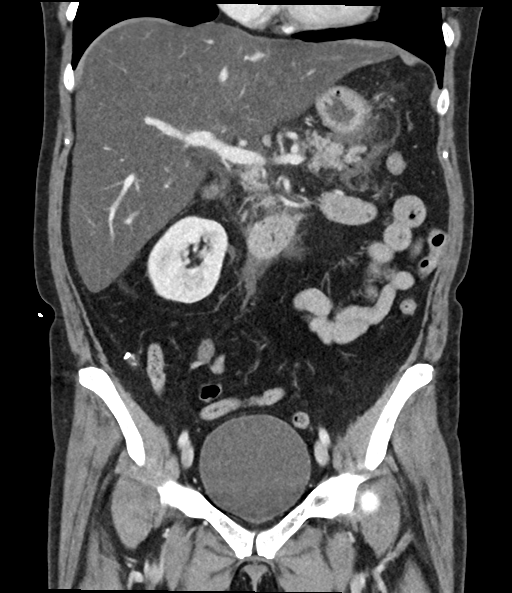

[16 of 46 positions shown; findings below may reference images not displayed]

FINDINGS: Lower chest: Lung bases are clear. No effusions. Heart is normal
size.

Hepatobiliary: Severe diffuse fatty infiltration throughout the
liver. No focal abnormality. Gallbladder unremarkable.

Pancreas: Inflammation/stranding around the pancreas and continuing
in the anterior pararenal spaces, left greater than right compatible
with acute pancreatitis. No ductal dilatation. No evidence of
pancreatic necrosis.

Spleen: No focal abnormality.  Normal size.

Adrenals/Urinary Tract: No adrenal abnormality. No focal renal
abnormality. No stones or hydronephrosis. Urinary bladder is
unremarkable.

Stomach/Bowel: Left colonic diverticulosis. No active
diverticulitis. Stomach and small bowel unremarkable.

Vascular/Lymphatic: No evidence of aneurysm or adenopathy.

Reproductive: Prior hysterectomy.  No adnexal masses.

Other: No free fluid or free air.

Musculoskeletal: No acute bony abnormality.
IMPRESSION: Stranding around the pancreas compatible with acute pancreatitis.

Severe diffuse hepatic steatosis.

Left colonic diverticulosis.

## 2023-07-18 IMAGING — DX DG CHEST 1V PORT
1 series · 1 of 1 positions shown · non-contrast
Comparison: none

CLINICAL DATA: wheezing

EXAM:
PORTABLE CHEST - 1 VIEW

[chest ap]
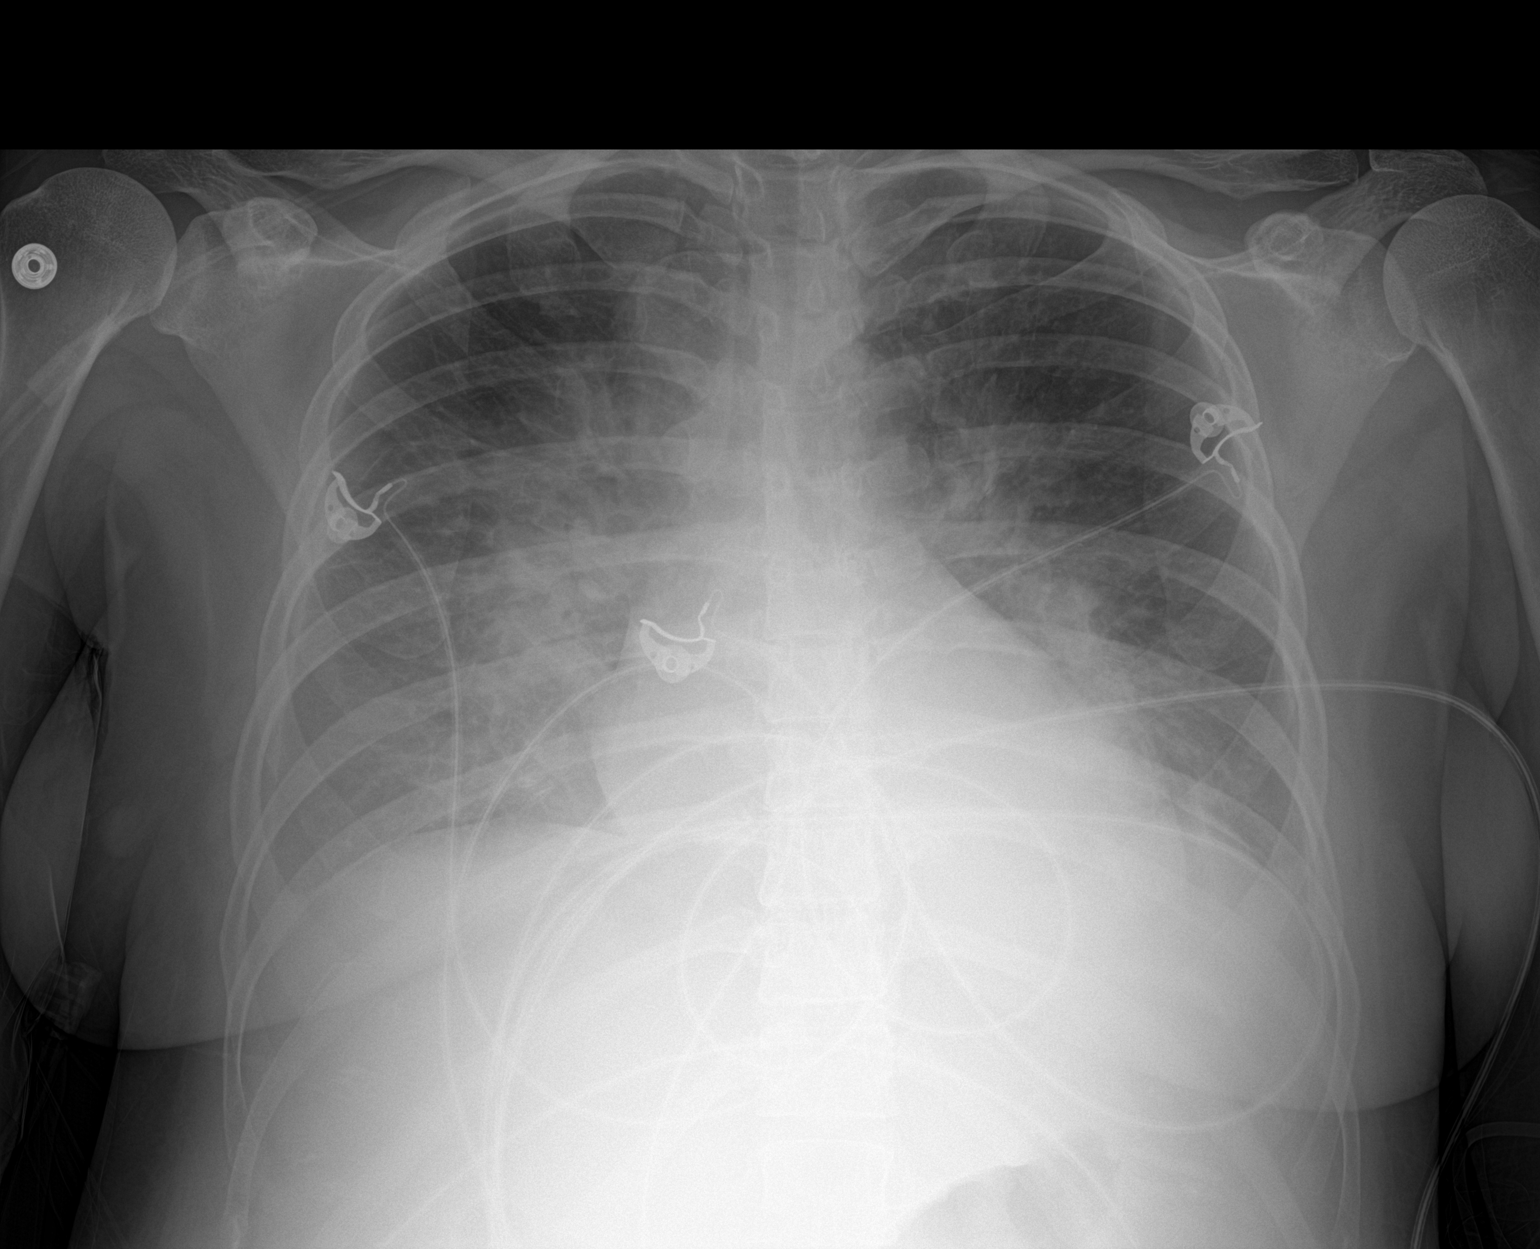

[1 of 1 positions shown; findings below may reference images not displayed]

FINDINGS: Moderate bilateral perihilar and infrahilar infiltrates or edema,
with relative peripheral sparing. No definite effusion.

Heart size and mediastinal contours are within normal limits.

No pneumothorax.

Visualized bones unremarkable.
IMPRESSION: Bilateral perihilar edema or infiltrates without cardiomegaly.
